# Patient Record
Sex: Male | Born: 1985 | Race: Black or African American | Hispanic: No | Marital: Married | Smoking: Never smoker
Health system: Southern US, Community
[De-identification: ages and names within clinical notes are randomized; demographics above are authoritative.]

## PROBLEM LIST (undated history)

## (undated) HISTORY — PX: NO PAST SURGERIES: SHX2092

---

## 2013-11-15 ENCOUNTER — Emergency Department (HOSPITAL_COMMUNITY)
Admission: EM | Admit: 2013-11-15 | Discharge: 2013-11-15 | Disposition: A | Discharge status: Home / Self Care | Payer: No Typology Code available for payment source | Attending: Emergency Medicine | Admitting: Emergency Medicine

## 2013-11-15 ENCOUNTER — Encounter (HOSPITAL_COMMUNITY): Payer: Self-pay | Admitting: Emergency Medicine

## 2013-11-15 DIAGNOSIS — Y9389 Activity, other specified: Secondary | ICD-10-CM | POA: Diagnosis not present

## 2013-11-15 DIAGNOSIS — Z880 Allergy status to penicillin: Secondary | ICD-10-CM | POA: Insufficient documentation

## 2013-11-15 DIAGNOSIS — Y92410 Unspecified street and highway as the place of occurrence of the external cause: Secondary | ICD-10-CM | POA: Diagnosis not present

## 2013-11-15 DIAGNOSIS — Y998 Other external cause status: Secondary | ICD-10-CM | POA: Diagnosis not present

## 2013-11-15 DIAGNOSIS — S299XXA Unspecified injury of thorax, initial encounter: Secondary | ICD-10-CM | POA: Diagnosis present

## 2013-11-15 DIAGNOSIS — M546 Pain in thoracic spine: Secondary | ICD-10-CM

## 2013-11-15 MED ORDER — NAPROXEN 250 MG PO TABS
500.0000 mg | ORAL_TABLET | Freq: Once | ORAL | Status: AC
  Administered 2013-11-15: 500 mg via ORAL
  Filled 2013-11-15: qty 2

## 2013-11-15 MED ORDER — CYCLOBENZAPRINE HCL 10 MG PO TABS: 10.0000 mg | ORAL_TABLET | Freq: Two times a day (BID) | ORAL | Status: DC | PRN

## 2013-11-15 MED ORDER — NAPROXEN 500 MG PO TABS: 500.0000 mg | ORAL_TABLET | Freq: Two times a day (BID) | ORAL | Status: DC

## 2013-11-15 MED ORDER — CYCLOBENZAPRINE HCL 10 MG PO TABS
10.0000 mg | ORAL_TABLET | Freq: Once | ORAL | Status: AC
  Administered 2013-11-15: 10 mg via ORAL
  Filled 2013-11-15: qty 1

## 2013-11-15 NOTE — Discharge Instructions (Signed)
Please follow the directions provided. Please take the naproxen by mouth twice a day and you may take the Flexeril by mouth twice a day for pain and muscle spasms. If your pain persists, he may use the orthopedic referral given.don't hesitate to return for any new, worsening, or concerning symptoms.  SEEK IMMEDIATE MEDICAL CARE IF:  You have numbness, tingling, or weakness in the arms or legs.  You develop severe headaches not relieved with medicine.  You have severe neck pain, especially tenderness in the middle of the back of your neck.  You have changes in bowel or bladder control.  There is increasing pain in any area of the body.  You have shortness of breath, light-headedness, dizziness, or fainting.  You have chest pain.  You feel sick to your stomach (nauseous), throw up (vomit), or sweat.  You have increasing abdominal discomfort.  There is blood in your urine, stool, or vomit.  You have pain in your shoulder (shoulder strap areas).  You feel your symptoms are getting worse.    Emergency Department Resource Guide 1) Find a Doctor and Pay Out of Pocket Although you won't have to find out who is covered by your insurance plan, it is a good idea to ask around and get recommendations. You will then need to call the office and see if the doctor you have chosen will accept you as a new patient and what types of options they offer for patients who are self-pay. Some doctors offer discounts or will set up payment plans for their patients who do not have insurance, but you will need to ask so you aren't surprised when you get to your appointment.  2) Contact Your Local Health Department Not all health departments have doctors that can see patients for sick visits, but many do, so it is worth a call to see if yours does. If you don't know where your local health department is, you can check in your phone book. The CDC also has a tool to help you locate your state's health department, and many  state websites also have listings of all of their local health departments.  3) Find a Walk-in Clinic If your illness is not likely to be very severe or complicated, you may want to try a walk in clinic. These are popping up all over the country in pharmacies, drugstores, and shopping centers. They're usually staffed by nurse practitioners or physician assistants that have been trained to treat common illnesses and complaints. They're usually fairly quick and inexpensive. However, if you have serious medical issues or chronic medical problems, these are probably not your best option.  No Primary Care Doctor: - Call Health Connect at  [phone] - they can help you locate a primary care doctor that  accepts your insurance, provides certain services, etc. - Physician Referral Service- [phone]  Chronic Pain Problems: Organization         Address  Phone   Notes  [facility]  [phone]) [phone] Patients need to be referred by their primary care doctor.   Medication Assistance: Organization         Address  Phone   Notes  [facility], Thessistance Program [address] [place], [place] [address] [phone] --Must be a resident of [place] -- Must have NO insurance coverage whatsoever (no Medicaid/ Medicare, etc.) -- The pt. MUST have a primary care doctor that directs their care regularly and follows them in the community  [facility]ist  [phone]   [employer]  [phone]    Agencies that provide inexpensive medical care: Organization         Address  Phone   Notes  [facility]  [phone]) [phone]   [facility]    [phone](336) [phone]   [facility] [address] [place], [place] [address] [phone]) [phone]   [facility] [address], [place] [phone]) [phone]   Planned Parenthood    [phone]   [facility]    [phone](336) [phone]   Community Health and  [facility]  [address], [place] Phone:  [phone], Fax:  [phone] Hours of Operation:  9 am - 6 pm, M-F.  Also accepts Medicaid/Medicare and self-pay.  [facility]  [address], [place] Phone: [phone](336) [phone], Fax: [phone](336) [phone]. Hours of Operation:  8:30 am - 5:30 pm, M-F.  Also accepts Medicaid and self-pay.  [facility] [place] [address], [place] Phone: [phone]) [phone]   [facility] [address], [place], [place] [phone], Ext. [phone] Mondays & Thursdays: 7-9 AM.  First 15 patients are seen on a first come, first serve basis.    Medicaid-accepting [facility] Providers:  Organization         Address  Phone   Notes  [facility] [address], [place] [phone] Also accepts self-pay patients.  [facility] [address], [place]  [phone]   [facility] [address], [place] [phone]) [phone]   [facility] [address] [place] [address], [place] [phone]) [phone]   Renaye RakersVeita Bland [address], [place]   [phone] Only accepts [place] Access [place] patients after they have their name applied to their card.   Self-Pay (no insurance) in [facility]:  Organization         Address  Phone   Notes  Sickle Cell Patients, [facility] [address], [place] [phone]   [facility] [address], [place] [phone]) [phone]   [facility] Aristes  [address], [place] [phone]   [facility]/Dr. Osei-Bonsu  [address] [place] [address], [place] or [phone], [place] [phone]) [phone] Phone number for both [place] and [place] locations is the same.  [facility] [address], [place] [phone]   [facility] [address]  [place] [address], [place] or [address] [phone]) [phone]) [phone]   [facility] [address], [place] [phone](336) [phone], phone; [phone]) [phone], fax Sees patients 1st and 3rd Saturday of every month.  Must not qualify for public or private insurance (i.e. Medicaid, Medicare, [place] Health Choice, Veterans' Benefits)  Household income should be no more than 200% of the poverty level The clinic cannot treat you if you are pregnant or think you are pregnant  Sexually transmitted diseases are not treated at the clinic.    Dental Care: Organization         Address  Phone  Notes  [facility] Department of [facility] [address], [place] [phone] Accepts children up to age 29 who are enrolled in [place] or [facility] Health Choice;  pregnant women with a Medicaid card; and children who have applied for Medicaid or [place] Health Choice, but were declined, whose parents can pay a reduced fee at time of service.  [facility] of [facility]  [address], [place] [phone] Accepts children up to age 52 who are enrolled in [place] or [place] Health Choice; pregnant women with a Medicaid card; and children who have applied for Medicaid or [place] Health Choice, but were declined, whose parents can pay a reduced fee at time of service.  [facility]  [address] [place], [place] [phone] Patients are seen by appointment only. Walk-ins are not accepted. [facility] will see patients 31 years of age and older. Monday - Tuesday (8am-5pm) Most Wednesdays (8:30-5pm) $30 per visit, cash only  [facility] Access PROGRAM  [address], [facility] [phone] Patients are seen by appointment only. Walk-ins are not accepted. [facility] will see patients 53 years of age and older. One Wednesday Evening (Monthly: Volunteer Based).  $30 per visit, cash only  [employer]  [phone] for adults; Children under age 64, call Graduate Pediatric Dentistry at [phone]. Children aged 89-14, please call [phone] to request a pediatric application.  Dental services are provided in all areas of dental care including fillings, crowns and bridges, complete and partial dentures, implants, gum treatment, root canals, and extractions. Preventive care is also provided. Treatment is provided to both adults and children. Patients are selected via a lottery and there is often a waiting list.   [facility] [address], [place]  [phone] [email or web address]   [facility] [address] [place], [place] [phone], Ext. [phone] Second and Fourth Thursday of each month, opens at 6:30 AM; Clinic ends at 9 AM.  Patients are seen on a first-come first-served basis, and a limited number are seen during each clinic.   [facility]  [address] Ether [place]ins [place], [place] [phone]   Eligibility Requirements You must have lived in [place], [place], or [place] counties for at least the last three months.   You cannot be eligible for state or federal sponsored [place], including [employer], [place], or [employer].   You generally cannot be eligible for healthcare insurance through your employer.    How to apply: Eligibility screenings are held every Tuesday and Wednesday afternoon from 1:00 pm until 4:00 pm. You do not need an appointment for the interview!  [facility] [address], [place], [place] [phone]   [facility]  [phone]   [facility] Health Department  [phone]   [facility] Health Department  [phone]    Behavioral Health Resources in the Community: Intensive Outpatient Programs Organization         Address  Phone  Notes  [facility] [address] [place], [place]  [phone]   [facility] Outpatient [address] [place], [place] [phone]   [facility]: [facility] Drug Sv[facility] [address] [place], [place]  [phone]   [facility] [address]  [place], [place] [phone] or [phone]   Substance Abuse Resources Organization         Address  Phone  Notes  Al[facility] Drug Services  [phone]   [facility]  [phone]   [facility] [place]  [phone]  Floydene Flock  [phone]   Residential & Outpatient Substance Abuse Program  [phone]   Psychological Services Organization         Address  Phone  Notes  [facility] Behavior[facility] Health  [phone]   [facility]  [phone]   [facility] Mental Health [address] [place] [phone] or [phone]    Mobile Crisis Teams Organization         Address  Phone  Notes  [facility], Mobile Crisis Care Unit  [phone]   [facility]  [address] [place], [place] [phone]   Doristine Locks [address] [place] [phone]    Self-Help/Support Groups Organization         Address  Phone             Notes  [facility] [place] - variety of support groups  [phone]- [phone] Call for more information  Narcotics Anonymous (NA), [facility] [address], [employer] [place]  2 meetings at this location   Statistician         Address  Phone  Notes  [facility] [address],    [place]  [phone]   [facility]  [address] [place] [address], [place], [place] [phone]   [facility] [address] [place], [place] [phone] Admissions: 8am-3pm M-F  [facility] [address]    [place], [place] [phone]   [facility] [address] [place], [place], [place] [phone]   [facility] [address]    [place], [place] [phone]   [facility] - Intensive Outpatient [address], [place], [place] [phone]   [facility] ([facility] [address] [place].,  [place], [place] [phone] or [phone]   [facility] ([facility]) [address], [place], [place] [phone] Accepts Medicaid  [facility] [place] [address],  [place] [phone] Substance Abuse/Addiction Treatment   [facility] Organization         Address  Phone  Notes  [facility]  [phone]   Angie Fava, PhD [address] [place], [place]   [phone] or [phone]   [facility]   [address] [place], [place] [phone]   [facility] [address] [place], [place] [phone] Insurance/Medicaid/sponsorship through [facility] and Families [address]                                    [place], [place] [phone] Therapy/tele-psych/case  [facility] [address], [place] [phone]    Dr. Lolly Mustache  [phone]   [facility] [place]  [employer] [facility] Dept. 1) [address] [place] 2) [address], [place] 3)  [address], [place] [phone]  [phone]   [facility] Child Abuse Hotline 236-785-1354 or [phone] (After Hours)
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
## 2013-11-17 ENCOUNTER — Emergency Department (HOSPITAL_COMMUNITY)
Admission: EM | Admit: 2013-11-17 | Discharge: 2013-11-17 | Disposition: A | Discharge status: Home / Self Care | Payer: No Typology Code available for payment source | Attending: Emergency Medicine | Admitting: Emergency Medicine

## 2013-11-17 ENCOUNTER — Encounter (HOSPITAL_COMMUNITY): Payer: Self-pay | Admitting: Emergency Medicine

## 2013-11-17 DIAGNOSIS — G8911 Acute pain due to trauma: Secondary | ICD-10-CM | POA: Insufficient documentation

## 2013-11-17 DIAGNOSIS — M542 Cervicalgia: Secondary | ICD-10-CM | POA: Diagnosis present

## 2013-11-17 DIAGNOSIS — M25512 Pain in left shoulder: Secondary | ICD-10-CM | POA: Insufficient documentation

## 2013-11-17 DIAGNOSIS — M25511 Pain in right shoulder: Secondary | ICD-10-CM | POA: Diagnosis not present

## 2013-11-17 DIAGNOSIS — Z791 Long term (current) use of non-steroidal anti-inflammatories (NSAID): Secondary | ICD-10-CM | POA: Diagnosis not present

## 2013-11-17 DIAGNOSIS — M549 Dorsalgia, unspecified: Secondary | ICD-10-CM | POA: Diagnosis not present

## 2013-11-17 DIAGNOSIS — Z880 Allergy status to penicillin: Secondary | ICD-10-CM | POA: Diagnosis not present

## 2013-11-17 NOTE — Discharge Instructions (Signed)
Continue to take your medications as prescribed. [facility].  See below for further instructions.
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
## 2013-11-22 ENCOUNTER — Emergency Department (HOSPITAL_COMMUNITY)
Admission: EM | Admit: 2013-11-22 | Discharge: 2013-11-22 | Disposition: A | Discharge status: Home / Self Care | Payer: No Typology Code available for payment source

## 2013-11-22 NOTE — ED Notes (Signed)
Cannot find pt no answer to name or paGE

## 2013-11-22 NOTE — ED Notes (Signed)
CANNOT FIND PT NO ANSWER TO NAME IN WAITING ROOM OR OUTSIDE

## 2017-10-02 ENCOUNTER — Emergency Department (HOSPITAL_COMMUNITY)
Admission: EM | Admit: 2017-10-02 | Discharge: 2017-10-02 | Disposition: A | Discharge status: Home / Self Care | Payer: Self-pay | Attending: Emergency Medicine | Admitting: Emergency Medicine

## 2017-10-02 ENCOUNTER — Encounter (HOSPITAL_COMMUNITY): Payer: Self-pay | Admitting: *Deleted

## 2017-10-02 ENCOUNTER — Other Ambulatory Visit: Payer: Self-pay

## 2017-10-02 DIAGNOSIS — M545 Low back pain: Secondary | ICD-10-CM | POA: Insufficient documentation

## 2017-10-02 MED ORDER — KETOROLAC TROMETHAMINE 60 MG/2ML IM SOLN
60.0000 mg | Freq: Once | INTRAMUSCULAR | Status: AC
  Administered 2017-10-02: 60 mg via INTRAMUSCULAR
  Filled 2017-10-02: qty 2

## 2017-10-02 MED ORDER — DEXAMETHASONE SODIUM PHOSPHATE 10 MG/ML IJ SOLN
10.0000 mg | Freq: Once | INTRAMUSCULAR | Status: AC
  Administered 2017-10-02: 10 mg via INTRAMUSCULAR
  Filled 2017-10-02: qty 1

## 2017-10-02 MED ORDER — NAPROXEN 500 MG PO TABS: 500.0000 mg | ORAL_TABLET | Freq: Two times a day (BID) | ORAL | 0 refills | Status: DC

## 2017-10-02 MED ORDER — METHOCARBAMOL 500 MG PO TABS: 500.0000 mg | ORAL_TABLET | Freq: Three times a day (TID) | ORAL | 0 refills | Status: DC | PRN

## 2017-10-02 NOTE — ED Triage Notes (Signed)
Pt c/o low back pain for the past two days. Denies bowel or bladder incontinence.

## 2017-10-02 NOTE — ED Provider Notes (Signed)
[facility] EMERGENCY DEPARTMENT Provider Note   CSN: [id number] Arrival date & time: 10/02/17  0013     History   Chief Complaint Chief Complaint  Patient presents with  . Back Pain    HPI Kevin Mckenzie is a 32 y.o. male.   The history is provided by the patient. No language interpreter was used.  Back Pain   This is a new problem. Episode onset: 2 days ago. The problem occurs constantly. The problem has been gradually improving. The pain is associated with no known injury. The pain is present in the lumbar spine. The quality of the pain is described as stabbing. The pain radiates to the right thigh. The pain is moderate. The symptoms are aggravated by bending and twisting. Pertinent negatives include no fever, no bowel incontinence, no bladder incontinence, no paresthesias and no weakness. He has tried NSAIDs for the symptoms. The treatment provided mild relief. Risk factors include obesity (Hx of similar pain a few years ago; performs heavy lifting frequently.).    History reviewed. No pertinent past medical history.  There are no active problems to display for this patient.   History reviewed. No pertinent surgical history.      Home Medications    Prior to Admission medications   Medication Sig Start Date End Date Taking? Authorizing Provider  cyclobenzaprine (FLEXERIL) 10 MG tablet Take 1 tablet (10 mg total) by mouth 2 (two) times daily as needed for muscle spasms. 11/15/13   Harle Battiest, NP  methocarbamol (ROBAXIN) 500 MG tablet Take 1 tablet (500 mg total) by mouth every 8 (eight) hours as needed for muscle spasms. 10/02/17   Antony Madura, PA-C  naproxen (NAPROSYN) 500 MG tablet Take 1 tablet (500 mg total) by mouth 2 (two) times daily with a meal. 10/02/17   Antony Madura, PA-C    Family History No family history on file.  Social History Social History   Tobacco Use  . Smoking status: Never Smoker  . Smokeless tobacco: Never Used    Substance Use Topics  . Alcohol use: No  . Drug use: No     Allergies   Penicillins   Review of Systems Review of Systems  Constitutional: Negative for fever.  Gastrointestinal: Negative for bowel incontinence.  Genitourinary: Negative for bladder incontinence.  Musculoskeletal: Positive for back pain.  Neurological: Negative for weakness and paresthesias.  Ten systems reviewed and are negative for acute change, except as noted in the HPI.    Physical Exam Updated Vital Signs BP (!) 159/83   Pulse 97   Temp 98.1 F (36.7 C)   Resp 17   SpO2 98%   Physical Exam  Constitutional: He is oriented to person, place, and time. He appears well-developed and well-nourished. No distress.  Nontoxic appearing and in NAD  HENT:  Head: Normocephalic and atraumatic.  Eyes: Conjunctivae and EOM are normal. No scleral icterus.  Neck: Normal range of motion.  Cardiovascular: Normal rate, regular rhythm and intact distal pulses.  Pulmonary/Chest: Effort normal. No respiratory distress.  Respirations even and unlabored  Musculoskeletal: Normal range of motion.  No reproducible pain on palpation of the low back. Positive straight leg raise on the right. Negative crossed straight leg raise. No bony deformities, step offs, crepitus to the lumbosacral midline.  Neurological: He is alert and oriented to person, place, and time. He exhibits normal muscle tone. Coordination normal.  Ambulatory with antalgic gait.  Skin: Skin is warm and dry. No rash noted. He is  not diaphoretic. No erythema. No pallor.  Psychiatric: He has a normal mood and affect. His behavior is normal.  Nursing note and vitals reviewed.    ED Treatments / Results  Labs (all labs ordered are listed, but only abnormal results are displayed) Labs Reviewed - No data to display  EKG None  Radiology No results found.  Procedures Procedures (including critical care time)  Medications Ordered in ED Medications   ketorolac (TORADOL) injection 60 mg (has no administration in time range)  dexamethasone (DECADRON) injection 10 mg (has no administration in time range)     Initial Impression / Assessment and Plan / ED Course  I have reviewed the triage vital signs and the nursing notes.  Pertinent labs & imaging results that were available during my care of the patient were reviewed by me and considered in my medical decision making (see chart for details).     Patient with back pain.  Hx of similar pain which improved with supportive treatment.  No reported injury or fall inciting onset of symptoms 2 days ago.  Patient neurovascularly intact on exam.  Patient can walk but states is painful.  No loss of bowel or bladder control.  No concern for cauda equina.  No fever, hx of CA, hx of IVDU.  Plan for discharge on Naproxen and Robaxin.  Advised alternation of ice and heat.  Return precautions discussed and provided. Patient discharged in stable condition with no unaddressed concerns.   Final Clinical Impressions(s) / ED Diagnoses   Final diagnoses:  Acute right-sided low back pain, with sciatica presence unspecified    ED Discharge Orders         Ordered    naproxen (NAPROSYN) 500 MG tablet  2 times daily with meals     10/02/17 0240    methocarbamol (ROBAXIN) 500 MG tablet  Every 8 hours PRN     10/02/17 0240           Antony Madura, PA-C 10/02/17 0248    Zadie Rhine, MD 10/02/17 [phone]

## 2017-10-02 NOTE — ED Notes (Signed)
Pt refused discharge vitals 

## 2017-10-02 NOTE — Discharge Instructions (Signed)
Alternate ice and heat to areas of injury 3-4 times per day to limit inflammation and spasm.  Avoid strenuous activity and heavy lifting.  We recommend consistent use of naproxen in addition to Robaxin for muscle spasms. Do not drive or drink alcohol after taking Robaxin as it may make you drowsy and impair your judgment.  We recommend follow-up with a primary care doctor to ensure resolution of symptoms.  Return to the ED for any new or concerning symptoms. 

## 2017-10-02 NOTE — ED Notes (Signed)
Patient verbalizes understanding of discharge instructions. Opportunity for questioning and answers were provided. Armband removed by staff, pt discharged from ED ambulatory.   

## 2021-04-19 LAB — LAB REPORT - SCANNED
A1c: 6.6
EGFR: 87

## 2021-06-18 ENCOUNTER — Ambulatory Visit
Admission: RE | Admit: 2021-06-18 | Discharge: 2021-06-18 | Disposition: A | Discharge status: Home / Self Care | Payer: Managed Care, Other (non HMO) | Source: Ambulatory Visit | Attending: Physician Assistant | Admitting: Physician Assistant

## 2021-06-18 ENCOUNTER — Other Ambulatory Visit: Payer: Self-pay | Admitting: Physician Assistant

## 2021-06-18 DIAGNOSIS — M7041 Prepatellar bursitis, right knee: Secondary | ICD-10-CM

## 2021-07-07 ENCOUNTER — Other Ambulatory Visit: Payer: Self-pay

## 2021-07-07 ENCOUNTER — Encounter (HOSPITAL_COMMUNITY): Payer: Self-pay

## 2021-07-07 ENCOUNTER — Emergency Department (HOSPITAL_COMMUNITY)
Admission: EM | Admit: 2021-07-07 | Discharge: 2021-07-07 | Disposition: A | Discharge status: Home / Self Care | Payer: Managed Care, Other (non HMO) | Attending: Emergency Medicine | Admitting: Emergency Medicine

## 2021-07-07 ENCOUNTER — Emergency Department (HOSPITAL_COMMUNITY): Payer: Managed Care, Other (non HMO)

## 2021-07-07 DIAGNOSIS — S6992XA Unspecified injury of left wrist, hand and finger(s), initial encounter: Secondary | ICD-10-CM | POA: Diagnosis present

## 2021-07-07 DIAGNOSIS — Z23 Encounter for immunization: Secondary | ICD-10-CM | POA: Diagnosis not present

## 2021-07-07 DIAGNOSIS — W540XXA Bitten by dog, initial encounter: Secondary | ICD-10-CM | POA: Insufficient documentation

## 2021-07-07 DIAGNOSIS — S61217A Laceration without foreign body of left little finger without damage to nail, initial encounter: Secondary | ICD-10-CM | POA: Diagnosis not present

## 2021-07-07 DIAGNOSIS — S61512A Laceration without foreign body of left wrist, initial encounter: Secondary | ICD-10-CM | POA: Diagnosis not present

## 2021-07-07 DIAGNOSIS — Y92007 Garden or yard of unspecified non-institutional (private) residence as the place of occurrence of the external cause: Secondary | ICD-10-CM | POA: Diagnosis not present

## 2021-07-07 MED ORDER — TETANUS-DIPHTH-ACELL PERTUSSIS 5-2.5-18.5 LF-MCG/0.5 IM SUSY
0.5000 mL | PREFILLED_SYRINGE | Freq: Once | INTRAMUSCULAR | Status: AC
  Administered 2021-07-07: 0.5 mL via INTRAMUSCULAR
  Filled 2021-07-07: qty 0.5

## 2021-07-07 MED ORDER — ACETAMINOPHEN 325 MG PO TABS
650.0000 mg | ORAL_TABLET | Freq: Once | ORAL | Status: AC
Start: 2021-07-07 — End: 2021-07-07
  Administered 2021-07-07: 650 mg via ORAL
  Filled 2021-07-07: qty 2

## 2021-07-07 MED ORDER — IBUPROFEN 400 MG PO TABS
600.0000 mg | ORAL_TABLET | Freq: Once | ORAL | Status: AC
  Administered 2021-07-07: 600 mg via ORAL
  Filled 2021-07-07: qty 1

## 2021-07-07 MED ORDER — BACITRACIN ZINC 500 UNIT/GM EX OINT
TOPICAL_OINTMENT | Freq: Once | CUTANEOUS | Status: AC
  Administered 2021-07-07: 1 via TOPICAL
  Filled 2021-07-07: qty 1.8

## 2021-07-07 MED ORDER — LIDOCAINE HCL (PF) 1 % IJ SOLN
30.0000 mL | Freq: Once | INTRAMUSCULAR | Status: AC
  Administered 2021-07-07: 30 mL
  Filled 2021-07-07: qty 30

## 2021-07-07 MED ORDER — SULFAMETHOXAZOLE-TRIMETHOPRIM 800-160 MG PO TABS: 1.0000 | ORAL_TABLET | Freq: Two times a day (BID) | ORAL | 0 refills | Status: AC

## 2021-07-07 NOTE — Discharge Instructions (Addendum)
Please use Tylenol or ibuprofen for pain.  You may use 600 mg ibuprofen every 6 hours or 1000 mg of Tylenol every 6 hours.  You may choose to alternate between the 2.  This would be most effective.  Not to exceed 4 g of Tylenol within 24 hours.  Not to exceed 3200 mg ibuprofen 24 hours.  Please take the entire course of antibiotics that I prescribed.  I recommend that you take them on a full stomach.  If you see any signs of worsening swelling, redness, pain, purulent drainage please return the emergency department for further evaluation as these can be signs of an infection.  If you are concerned about the weight that the wound is healing I recommend that you follow-up with a hand surgeon for further evaluation.  The stitches do dissolve that you can return for removal if they are irritating you in around 10 days.  As we discussed you have a low concern for the animal that you were bitten by being rabid and choose not to receive rabies prophylaxis at this time, however you can return anytime in the next 7 days to begin rabies vaccine if you develop concern for possible rabies exposure.

## 2021-07-07 NOTE — ED Provider Notes (Signed)
[facility] EMERGENCY DEPARTMENT Provider Note   CSN: [id number] Arrival date & time: 07/07/21  1051     History  Chief Complaint  Patient presents with   Animal Bite    Kevin Mckenzie is a 36 y.o. male with no significant past medical history who presents with concern for dog bite sustained just prior to arrival with laceration noted on left fifth finger and left wrist.  Patient reports that it was an unknown dog but did not have a collar.  Patient reports dog was standing in the yard, and did not want to leave despite him shoeing it away.  Patient reports that he attempted to approach the dog and then it started growling and lunged at him.  Patient reports 6 out of 10 pain on arrival, now reports 10/10 pain.  He denies numbness, tingling.  He does not know when his last tetanus shot was.  He does not know about the dog's vaccination status, and animal control was not contacted to trap the dog.  HPI     Home Medications Prior to Admission medications   Medication Sig Start Date End Date Taking? Authorizing Provider  sulfamethoxazole-trimethoprim (BACTRIM DS) 800-160 MG tablet Take 1 tablet by mouth 2 (two) times daily for 7 days. 07/07/21 07/14/21 Yes Deyna Carbon H, PA-C  cyclobenzaprine (FLEXERIL) 10 MG tablet Take 1 tablet (10 mg total) by mouth 2 (two) times daily as needed for muscle spasms. 11/15/13   Harle Battiest, NP  methocarbamol (ROBAXIN) 500 MG tablet Take 1 tablet (500 mg total) by mouth every 8 (eight) hours as needed for muscle spasms. 10/02/17   Antony Madura, PA-C  naproxen (NAPROSYN) 500 MG tablet Take 1 tablet (500 mg total) by mouth 2 (two) times daily with a meal. 10/02/17   Antony Madura, PA-C      Allergies    Penicillins    Review of Systems   Review of Systems  Skin:  Positive for wound.  All other systems reviewed and are negative.   Physical Exam Updated Vital Signs BP 128/85   Pulse 89   Temp 97.8 F (36.6 C) (Oral)   Resp  16   Ht 5\' 11"  (1.803 m)   Wt 125.6 kg   SpO2 100%   BMI 38.63 kg/m  Physical Exam Vitals and nursing note reviewed.  Constitutional:      General: He is not in acute distress.    Appearance: Normal appearance.  HENT:     Head: Normocephalic and atraumatic.  Eyes:     General:        Right eye: No discharge.        Left eye: No discharge.  Cardiovascular:     Rate and Rhythm: Normal rate and regular rhythm.  Pulmonary:     Effort: Pulmonary effort is normal. No respiratory distress.  Musculoskeletal:        General: No deformity.     Comments: No visualized tendon damage or rupture.  Strength to flexion extension of the affected fifth digit.  Intact strength flexion, extension of the wrist.  Distally significant soft tissue swelling patient without significant tenderness palpation of the radius or ulna on the left.  Skin:    General: Skin is warm and dry.     Comments: Soft tissue swelling, small laceration noted to posterior wrist on the left.  Large laceration on the ventral aspect of proximal fifth phalanx.  Other scattered abrasions, and soft tissue swelling.  Neurological:  Mental Status: He is alert and oriented to person, place, and time.  Psychiatric:        Mood and Affect: Mood normal.        Behavior: Behavior normal.     ED Results / Procedures / Treatments   Labs (all labs ordered are listed, but only abnormal results are displayed) Labs Reviewed - No data to display  EKG None  Radiology DG Wrist Complete Left  Result Date: 07/07/2021 CLINICAL DATA:  dog bite EXAM: LEFT WRIST - COMPLETE 3+ VIEW; LEFT HAND - COMPLETE 3+ VIEW COMPARISON:  None Available. FINDINGS: There is no evidence of fracture or dislocation. There is no evidence of severe arthropathy or other focal bone abnormality. Marked subcutaneus soft tissue edema and emphysema along the lateral dorsal wrist. Associated 5 x 2 mm oval density within this superficial soft tissues of unclear etiology.  IMPRESSION: 1. Marked subcutaneus soft tissue edema and emphysema along the lateral dorsal wrist. Associated 5 x 2 mm oval density within this superficial soft tissues of unclear etiology. Finding could represent a retained foreign body. 2. No acute displaced fracture or dislocation of the bones of the left hand and wrist. Electronically Signed   By: Tish Frederickson M.D.   On: 07/07/2021 15:33   DG Hand Complete Left  Result Date: 07/07/2021 CLINICAL DATA:  dog bite EXAM: LEFT WRIST - COMPLETE 3+ VIEW; LEFT HAND - COMPLETE 3+ VIEW COMPARISON:  None Available. FINDINGS: There is no evidence of fracture or dislocation. There is no evidence of severe arthropathy or other focal bone abnormality. Marked subcutaneus soft tissue edema and emphysema along the lateral dorsal wrist. Associated 5 x 2 mm oval density within this superficial soft tissues of unclear etiology. IMPRESSION: 1. Marked subcutaneus soft tissue edema and emphysema along the lateral dorsal wrist. Associated 5 x 2 mm oval density within this superficial soft tissues of unclear etiology. Finding could represent a retained foreign body. 2. No acute displaced fracture or dislocation of the bones of the left hand and wrist. Electronically Signed   By: Tish Frederickson M.D.   On: 07/07/2021 15:33    Procedures .Marland KitchenLaceration Repair  Date/Time: 07/07/2021 4:36 PM  Performed by: Olene Floss, PA-C Authorized by: Olene Floss, PA-C   Consent:    Consent obtained:  Verbal   Consent given by:  Patient   Risks, benefits, and alternatives were discussed: yes     Risks discussed:  Infection, pain, need for additional repair, poor cosmetic result, poor wound healing, tendon damage and retained foreign body   Alternatives discussed:  No treatment Universal protocol:    Procedure explained and questions answered to patient or proxy's satisfaction: yes     Patient identity confirmed:  Verbally with patient Anesthesia:    Anesthesia  method:  Local infiltration   Local anesthetic:  Lidocaine 1% w/o epi Laceration details:    Location:  Shoulder/arm   Shoulder/arm location:  L lower arm   Length (cm):  2.5   Depth (mm):  4 Treatment:    Area cleansed with:  Povidone-iodine and saline   Amount of cleaning:  Extensive   Irrigation solution:  Sterile saline   Irrigation method:  Pressure wash Skin repair:    Repair method:  Sutures   Suture size:  5-0   Suture material:  Chromic gut   Suture technique:  Simple interrupted   Number of sutures:  2 Approximation:    Approximation:  Loose Repair type:    Repair  type:  Intermediate Post-procedure details:    Dressing:  Antibiotic ointment and non-adherent dressing   Procedure completion:  Tolerated .Marland KitchenLaceration Repair  Date/Time: 07/07/2021 4:37 PM  Performed by: Olene Floss, PA-C Authorized by: Olene Floss, PA-C   Consent:    Consent obtained:  Verbal   Consent given by:  Patient   Risks, benefits, and alternatives were discussed: yes     Risks discussed:  Infection, pain, poor cosmetic result, poor wound healing, tendon damage, need for additional repair, retained foreign body and nerve damage   Alternatives discussed:  No treatment Universal protocol:    Procedure explained and questions answered to patient or proxy's satisfaction: yes     Patient identity confirmed:  Verbally with patient Anesthesia:    Anesthesia method:  Local infiltration   Local anesthetic:  Lidocaine 1% w/o epi Laceration details:    Location:  Finger   Finger location:  L small finger   Length (cm):  4   Depth (mm):  8 Treatment:    Area cleansed with:  Povidone-iodine and saline   Amount of cleaning:  Extensive   Irrigation solution:  Sterile saline   Irrigation method:  Pressure wash Skin repair:    Repair method:  Sutures   Suture size:  5-0   Suture material:  Prolene   Suture technique:  Simple interrupted   Number of sutures:  6 Approximation:     Approximation:  Loose Repair type:    Repair type:  Intermediate Post-procedure details:    Dressing:  Antibiotic ointment and non-adherent dressing   Procedure completion:  Tolerated     Medications Ordered in ED Medications  lidocaine (PF) (XYLOCAINE) 1 % injection 30 mL (30 mLs Infiltration Given 07/07/21 1424)  Tdap (BOOSTRIX) injection 0.5 mL (0.5 mLs Intramuscular Given 07/07/21 1425)  ibuprofen (ADVIL) tablet 600 mg (600 mg Oral Given 07/07/21 1423)  acetaminophen (TYLENOL) tablet 650 mg (650 mg Oral Given 07/07/21 1423)  bacitracin ointment (1 Application Topical Given 07/07/21 1629)    ED Course/ Medical Decision Making/ A&P                           Medical Decision Making Amount and/or Complexity of Data Reviewed Radiology: ordered.  Risk OTC drugs. Prescription drug management.   This patient is a 36 y.o. male who presents to the ED for concern of dog bite, wrist injury, left fifth finger injury, this involves an extensive number of treatment options, and is a complaint that carries with it a high risk of complications and morbidity. The emergent differential diagnosis prior to evaluation includes, but is not limited to, tendon injury or rupture, foreign body, laceration requiring repair, fracture, dislocation.   This is not an exhaustive differential.   Past Medical History / Co-morbidities / Social History: Is not up-to-date on his tetanus shot, does not have rabies vaccine already.  No other contributory past medical history.  Additional history: Chart reviewed. Pertinent results include: Reviewed lab work, imaging from previous emergency department visits, no recent evaluations for evaluation of hand injuries.  Physical Exam: Physical exam performed. The pertinent findings include: Laceration on the medial, internal surface of the left fifth digit at the most proximal phalanx.  No evidence of tendon involvement.  Wound explored through full range of motion with no  evidence of foreign body.  Patient has a laceration on the dorsum of the left wrist that is somewhat dehisced, and a very shallow  laceration on the ventral aspect of the left wrist.  No wounds actively bleeding at time my evaluation.  Patient has significant soft tissue swelling of the affected extremity, no significant tenderness palpation of bony prominences.   Imaging Studies: I ordered imaging studies including plain film x-rays of the left wrist and left hand. I independently visualized and interpreted imaging which showed no evidence of fracture, dislocation, some soft tissue abnormalities, with concern for possible foreign body, both wrist and finger wounds were explored through full range of motion, and I did not note any foreign body. I agree with the radiologist interpretation.   Medications: I ordered medication including Tdap for tetanus prophylaxis.  Patient discharged on Bactrim as he has penicillin allergy and cannot tolerate Augmentin.  Additionally given ibuprofen, Tylenol for pain control in the emergency department today.  I had an extensive discussion with the patient regarding possible rabies prophylaxis.  Patient reports that the animal was normal in appearance with normal behavior until he approached it when it started growling.  He thinks that it was being territorial.  Although it did not have a collar he has low suspicion that it is rapid.  Discussed that there is able low to almost nonexistent rate of rabies and dogs in [facility].  Based on this description I do not think it is necessary that patient receive rabies prophylaxis, but discussed if he has any concerns that he should return within 7 days to begin prophylaxis.  Patient understands and agrees to this plan.  emergency department workup does not suggest an emergent condition requiring admission or immediate intervention beyond what has been performed at this time. The plan is: Lacerations repaired as described above,  encourage I Profen, Tylenol for pain, patient placed on Bactrim for animal bite prophylaxis, encouraged to follow-up for any worsening pain, or signs of infection. The patient is safe for discharge and has been instructed to return immediately for worsening symptoms, change in symptoms or any other concerns.  I discussed this case with my attending physician Dr. Criss Alvine who cosigned this note including patient's presenting symptoms, physical exam, and planned diagnostics and interventions. Attending physician stated agreement with plan or made changes to plan which were implemented.    Final Clinical Impression(s) / ED Diagnoses Final diagnoses:  Dog bite, initial encounter  Laceration of left little finger without foreign body without damage to nail, initial encounter  Laceration of left wrist, initial encounter    Rx / DC Orders ED Discharge Orders          Ordered    sulfamethoxazole-trimethoprim (BACTRIM DS) 800-160 MG tablet  2 times daily        07/07/21 1618              Aidah Forquer, [place] H, PA-C 07/07/21 1638    Pricilla Loveless, MD 07/07/21 2027

## 2021-07-07 NOTE — ED Triage Notes (Signed)
Patient with dog bite to left 5th finger and left wrist, unknown dog. Saline dressing applied and wound care completed

## 2021-12-14 ENCOUNTER — Ambulatory Visit
Admission: EM | Admit: 2021-12-14 | Discharge: 2021-12-14 | Disposition: A | Discharge status: Home / Self Care | Payer: BC Managed Care – PPO | Attending: Emergency Medicine | Admitting: Emergency Medicine

## 2021-12-14 DIAGNOSIS — Z1152 Encounter for screening for COVID-19: Secondary | ICD-10-CM | POA: Diagnosis not present

## 2021-12-14 DIAGNOSIS — J988 Other specified respiratory disorders: Secondary | ICD-10-CM | POA: Insufficient documentation

## 2021-12-14 DIAGNOSIS — Z20828 Contact with and (suspected) exposure to other viral communicable diseases: Secondary | ICD-10-CM | POA: Insufficient documentation

## 2021-12-14 DIAGNOSIS — B9789 Other viral agents as the cause of diseases classified elsewhere: Secondary | ICD-10-CM | POA: Diagnosis not present

## 2021-12-14 LAB — RESP PANEL BY RT-PCR (FLU A&B, COVID) ARPGX2
Influenza A by PCR: NEGATIVE
Influenza B by PCR: NEGATIVE
SARS Coronavirus 2 by RT PCR: NEGATIVE

## 2021-12-14 MED ORDER — IBUPROFEN 600 MG PO TABS: 600.0000 mg | ORAL_TABLET | Freq: Four times a day (QID) | ORAL | 0 refills | Status: DC | PRN

## 2021-12-14 MED ORDER — IPRATROPIUM BROMIDE 0.06 % NA SOLN: 2.0000 | Freq: Four times a day (QID) | NASAL | 0 refills | Status: DC

## 2021-12-14 MED ORDER — PROMETHAZINE-DM 6.25-15 MG/5ML PO SYRP: 5.0000 mL | ORAL_SOLUTION | Freq: Four times a day (QID) | ORAL | 0 refills | Status: DC | PRN

## 2021-12-14 NOTE — Discharge Instructions (Signed)
We we will contact you if your COVID or flu come back positive and prescribe the appropriate antiviral.  Start Mucinex-D to keep the mucous thin and to decongest you.   You may take 600 mg of motrin with 1000 mg of tylenol up to 3-4 times a day as needed for pain. This is an effective combination for pain.  Atrovent nasal spray to help with the nasal congestion and postnasal drip.  Use a NeilMed sinus rinse with distilled water as often as you want to to reduce nasal congestion. Follow the directions on the box.  Promethazine DM for the cough.  Go to www.goodrx.com to look up your medications. This will give you a list of where you can find your prescriptions at the most affordable prices. Or you can ask the pharmacist what the cash price is. This is frequently cheaper than going through insurance.

## 2021-12-14 NOTE — ED Provider Notes (Signed)
HPI  SUBJECTIVE:  Kevin Mckenzie is a 36 y.o. male who presents with generalized weakness, cough, chills, body aches, headache, nasal congestion, mucoid rhinorrhea, sinus pain and pressure, sore throat from postnasal drip, dry cough, wheezing starting yesterday.  No facial swelling, upper dental pain, shortness of breath, nausea, vomiting, diarrhea, abdominal pain, fevers.  Both of his kids have tested positive for influenza.  No known COVID exposure.  He did not get the flu or COVID-vaccines.  He is unable to sleep at night because of the cough.  No antipyretic in the past 6 hours.  He took 1000 g of Tylenol with improvement in his symptoms.  Symptoms are worse with movement.  He has no past medical history.  [facility].    History reviewed. No pertinent past medical history.  History reviewed. No pertinent surgical history.  History reviewed. No pertinent family history.  Social History   Tobacco Use   Smoking status: Never   Smokeless tobacco: Never  Vaping Use   Vaping Use: Never used  Substance Use Topics   Alcohol use: No   Drug use: No    No current facility-administered medications for this encounter.  Current Outpatient Medications:    acetaminophen (TYLENOL) 500 MG tablet, Take 1,000 mg by mouth every 6 (six) hours as needed., Disp: , Rfl:    ibuprofen (ADVIL) 600 MG tablet, Take 1 tablet (600 mg total) by mouth every 6 (six) hours as needed., Disp: 30 tablet, Rfl: 0   ipratropium (ATROVENT) 0.06 % nasal spray, Place 2 sprays into both nostrils 4 (four) times daily., Disp: 15 mL, Rfl: 0   promethazine-dextromethorphan (PROMETHAZINE-DM) 6.25-15 MG/5ML syrup, Take 5 mLs by mouth 4 (four) times daily as needed for cough., Disp: 118 mL, Rfl: 0  Allergies  Allergen Reactions   Penicillins      ROS  As noted in HPI.   Physical Exam  BP (!) 133/90 (BP Location: Right Arm)   Pulse 91   Temp 99.4 F (37.4 C) (Oral)   Resp 20   SpO2 95%    Constitutional: Well developed, well nourished, no acute distress Eyes:  EOMI, conjunctiva normal bilaterally HENT: Normocephalic, atraumatic,mucus membranes moist.  Extensive clear rhinorrhea.  Erythematous, swollen turbinates.  No maxillary, frontal sinus tenderness.  Unable to completely visualize tonsils and posterior oropharynx.  What I was able to see appears normal. Neck: Positive cervical lymphadenopathy Respiratory: Normal inspiratory effort, lungs clear bilaterally, good air movement Cardiovascular: Normal rate, regular rhythm, no murmurs rubs or gallops GI: nondistended skin: No rash, skin intact Musculoskeletal: no deformities Neurologic: Alert & oriented x 3, no focal neuro deficits Psychiatric: Speech and behavior appropriate   ED Course   Medications - No data to display  Orders Placed This Encounter  Procedures   Resp Panel by RT-PCR (Flu A&B, Covid) Anterior Nasal Swab    Standing Status:   Standing    Number of Occurrences:   1   Results for orders placed or performed during the hospital encounter of 12/14/21  Resp Panel by RT-PCR (Flu A&B, Covid) Anterior Nasal Swab   Specimen: Anterior Nasal Swab  Result Value Ref Range   SARS Coronavirus 2 by RT PCR NEGATIVE NEGATIVE   Influenza A by PCR NEGATIVE NEGATIVE   Influenza B by PCR NEGATIVE NEGATIVE    No results found for this or any previous visit (from the past 24 hour(s)). No results found.  ED Clinical Impression  1. Viral respiratory infection   2.  Exposure to influenza      ED Assessment/Plan     Patient with an upper respiratory infection after close exposure to influenza.  Checking COVID, flu.  Will prescribe antivirals if his testing comes back positive because of his unvaccinated status.  Offered to send home with a prescription of Tamiflu today, but patient would like to wait.  In the meantime, Atrovent nasal spray, Mucinex D, saline nasal irrigation, Tylenol/ibuprofen, Promethazine DM,  work note for 2 days.  Follow-up with PCP as needed.  Respiratory panel negative.  Plan as above.  Discussed labs, MDM, treatment plan, and plan for follow-up with patient. patient agrees with plan.   Meds ordered this encounter  Medications   ibuprofen (ADVIL) 600 MG tablet    Sig: Take 1 tablet (600 mg total) by mouth every 6 (six) hours as needed.    Dispense:  30 tablet    Refill:  0   ipratropium (ATROVENT) 0.06 % nasal spray    Sig: Place 2 sprays into both nostrils 4 (four) times daily.    Dispense:  15 mL    Refill:  0   promethazine-dextromethorphan (PROMETHAZINE-DM) 6.25-15 MG/5ML syrup    Sig: Take 5 mLs by mouth 4 (four) times daily as needed for cough.    Dispense:  118 mL    Refill:  0      *This clinic note was created using Scientist, clinical (histocompatibility and immunogenetics). Therefore, there may be occasional mistakes despite careful proofreading.  ?    Domenick Gong, MD 12/15/21 1207

## 2021-12-14 NOTE — ED Triage Notes (Signed)
Pt states that his kids were dx with the flu and now he has body aches, fever and cough.

## 2022-11-01 ENCOUNTER — Encounter: Payer: Self-pay | Admitting: Family Medicine

## 2022-11-01 ENCOUNTER — Ambulatory Visit (INDEPENDENT_AMBULATORY_CARE_PROVIDER_SITE_OTHER): Payer: Self-pay | Admitting: Family Medicine

## 2022-11-01 VITALS — BP 136/72 | HR 86 | Temp 98.5°F | Ht 71.0 in | Wt 299.8 lb

## 2022-11-01 DIAGNOSIS — Z0130 Encounter for examination of blood pressure without abnormal findings: Secondary | ICD-10-CM | POA: Insufficient documentation

## 2022-11-01 DIAGNOSIS — M7041 Prepatellar bursitis, right knee: Secondary | ICD-10-CM | POA: Insufficient documentation

## 2022-11-01 DIAGNOSIS — Z7689 Persons encountering health services in other specified circumstances: Secondary | ICD-10-CM

## 2022-11-01 DIAGNOSIS — Z1331 Encounter for screening for depression: Secondary | ICD-10-CM | POA: Insufficient documentation

## 2022-11-01 NOTE — Assessment & Plan Note (Signed)
Has had this drained in the past.  Based on chart review it seems as was done July 2023.  Nontender.  No evidence of infection.  Referred to sports medicine for drainage.  Patient will need to wear compression sleeve afterwards.  He has 1 from the last time, but never wore it.  By the time he obtains the sleeve the swelling had returned.

## 2022-11-01 NOTE — Assessment & Plan Note (Signed)
Patient states he takes his blood pressure on a wrist cuff at home and has had some high readings.  The readings he reported were 200/100 at the highest, but more in the 150s, 160s range.  We checked his blood pressure twice today and both times it was roughly around 132/80.  Advised patient to use a standard blood pressure cuff at home and record readings twice daily for the next 7 days and return them to [place].  Follow-up in 1 month for blood pressure recheck.  Sending out records request to [facility] physicians.

## 2022-11-01 NOTE — Patient Instructions (Addendum)
It was nice to see you today,  We addressed the following topics today: -Your right knee swelling is something called prepatellar bursitis.  This can be caused by an injury or fall.  It is a collection of fluid in your knee that we can have drained off.  I will send you to a sports medicine clinic to evaluate it and drain it.  After the treatment you should wear a compression sleeve on your knee. - Record your home blood pressures twice a day for 7 days and bring them back to [place].  I would prefer if you used a blood pressure cuff that you put around the bicep instead of the wrist.  They have these available at the pharmacies.  Omron is a reliable brand, but most the ones you can buy at the pharmacy are accurate. - Follow-up with [place] in 1 month to recheck your blood pressure and follow-up on the bursitis.  Have a great day,  Frederic Jericho, MD

## 2022-11-01 NOTE — Progress Notes (Signed)
New Patient Office Visit  Subjective    Patient ID: Kevin Mckenzie, male    DOB: [birth year]  Age: 37 y.o. MRN: [id number]  CC:  Chief Complaint  Patient presents with   Establish Care    HPI Kevin Mckenzie presents to establish care Patient was previously seeing [facility] physicians on [address].  Our location is closer to his home.  Patient was not being actively treated for any medical issues other than recently having prepatellar bursitis on the right knee.  Patient states he may have fallen prior to developing his bursitis.  Has never happened before.  Patient denies fever or chills.  No pain in the knee.  He had it drained once before at [facility] sports medicine.  Patient also complaining of high blood pressure readings at home.  He has a wrist cuff that he uses.  Today his blood pressure was normal on 2 readings.  He texted his wife and she replied that his readings were as high as 200/100 in the past.  Advised the patient to use a standard cuff instead of a wrist cuff.  Advised to can pick these up at the pharmacy and recommended a reliable brand.  Asked him to record his blood pressure readings at home and bring them back to [place].    Patient denies feelings of depression or anxiety.  Denies suicidal thoughts or ideation  PMH: no  PSH: no  FH: no  Tobacco use: no Alcohol use: no Drug use: no Marital status: married.  3 children,  16, 13, 11.   Employment: driver for [employer].    Outpatient Encounter Medications as of 11/01/2022  Medication Sig   acetaminophen (TYLENOL) 500 MG tablet Take 1,000 mg by mouth every 6 (six) hours as needed.   ibuprofen (ADVIL) 600 MG tablet Take 1 tablet (600 mg total) by mouth every 6 (six) hours as needed.   ipratropium (ATROVENT) 0.06 % nasal spray Place 2 sprays into both nostrils 4 (four) times daily.   promethazine-dextromethorphan (PROMETHAZINE-DM) 6.25-15 MG/5ML syrup Take 5 mLs by mouth 4 (four) times daily as needed for cough.    No facility-administered encounter medications on file as of 11/01/2022.    No past medical history on file.  No past surgical history on file.  No family history on file.  Social History   Socioeconomic History   Marital status: Married    Spouse name: Not on file   Number of children: Not on file   Years of education: Not on file   Highest education level: Not on file  Occupational History   Not on file  Tobacco Use   Smoking status: Never   Smokeless tobacco: Never  Vaping Use   Vaping status: Never Used  Substance and Sexual Activity   Alcohol use: No   Drug use: No   Sexual activity: Not on file  Other Topics Concern   Not on file  Social History Narrative   Not on file   Social Determinants of Health   Financial Resource Strain: Not on file  Food Insecurity: Not on file  Transportation Needs: Not on file  Physical Activity: Not on file  Stress: Not on file  Social Connections: Not on file  Intimate Partner Violence: Not on file    ROS      Objective    BP 136/72   Pulse 86   Temp 98.5 F (36.9 C) (Oral)   Ht 5\' 11"  (1.803 m)   Wt 299 lb  12 oz (136 kg)   SpO2 95%   BMI 41.81 kg/m   Physical Exam General: Alert, oriented HEENT: PERRLA, EOMI, will call this in CV: Regular rate and rhythm Pulmonary: Clear bilaterally GI: Soft, nontender. MSK: Enlarged swelling over the right patella.  Approximately the size of a tennis ball.  Nontender.  fluctuant.     Assessment & Plan:   Encounter to establish care  Prepatellar bursitis of right knee Assessment & Plan: Has had this drained in the past.  Based on chart review it seems as was done July 2023.  Nontender.  No evidence of infection.  Referred to sports medicine for drainage.  Patient will need to wear compression sleeve afterwards.  He has 1 from the last time, but never wore it.  By the time he obtains the sleeve the swelling had returned.  Orders: -     Ambulatory referral to Sports  Medicine  Blood pressure check Assessment & Plan: Patient states he takes his blood pressure on a wrist cuff at home and has had some high readings.  The readings he reported were 200/100 at the highest, but more in the 150s, 160s range.  We checked his blood pressure twice today and both times it was roughly around 132/80.  Advised patient to use a standard blood pressure cuff at home and record readings twice daily for the next 7 days and return them to [place].  Follow-up in 1 month for blood pressure recheck.  Sending out records request to [facility] physicians.   Positive screening for depression on 9-item Patient Health Questionnaire (PHQ-9) Assessment & Plan: Patient's PHQ-9 was all threes.  GAD-7 was all zeros.  Each questionnaire response was a straight line down the columns, (not individually circled).  Patient when asked did not endorse any depression or anxiety or suicidal thoughts.  Most likely the patient was not paying close attention to what he answered on the PHQ.    Flowsheet Row Office Visit from 11/01/2022 in [facility]  PHQ-9 Total Score 27        Return in about 4 weeks (around 11/29/2022) for Bursitis, blood pressure.   Kevin Kitty, MD

## 2022-11-01 NOTE — Assessment & Plan Note (Signed)
Patient's PHQ-9 was all threes.  GAD-7 was all zeros.  Each questionnaire response was a straight line down the columns, (not individually circled).  Patient when asked did not endorse any depression or anxiety or suicidal thoughts.  Most likely the patient was not paying close attention to what he answered on the PHQ.

## 2022-11-08 ENCOUNTER — Other Ambulatory Visit: Payer: Self-pay

## 2022-11-08 ENCOUNTER — Ambulatory Visit: Payer: BC Managed Care – PPO | Admitting: Family Medicine

## 2022-11-08 ENCOUNTER — Encounter: Payer: Self-pay | Admitting: Family Medicine

## 2022-11-08 VITALS — BP 151/94 | Ht 72.0 in | Wt 290.0 lb

## 2022-11-08 DIAGNOSIS — M7041 Prepatellar bursitis, right knee: Secondary | ICD-10-CM

## 2022-11-08 DIAGNOSIS — M25561 Pain in right knee: Secondary | ICD-10-CM

## 2022-11-08 MED ORDER — METHYLPREDNISOLONE ACETATE 40 MG/ML IJ SUSP: 40.0000 mg | Freq: Once | INTRAMUSCULAR | Status: AC

## 2022-11-08 NOTE — Progress Notes (Addendum)
PCP: Sandre Kitty, MD  Chief Complaint: Knee effusion Subjective:   HPI: Patient is a 37 y.o. male here for right sided knee effusion. Patient states that he had a knee effusion last July which he had drained. Patient was advised to get a knee sleeve and follow up with the physician at that time. Patient states that he was unable to go back due to other circumstances.  Patient is presenting today for the same effusion for the past 1 and half years.  Patient states that it is not painful and that there is no tenderness over the area.  Patient does not have any decreased range of motion.  Per patient, the effusion is about the same size as when he had originally drained in 2023 July.  Patient denies any tick bites or being outdoors a lot.  Patient is a Hospital doctor for [employer].  Patient denies any trauma at the original incidence but does note that he fell on his knee a few weeks ago but states that the effusion was already present prior to that.  History reviewed. No pertinent past medical history.  Current Outpatient Medications on File Prior to Visit  Medication Sig Dispense Refill   acetaminophen (TYLENOL) 500 MG tablet Take 1,000 mg by mouth every 6 (six) hours as needed.     ibuprofen (ADVIL) 600 MG tablet Take 1 tablet (600 mg total) by mouth every 6 (six) hours as needed. 30 tablet 0   ipratropium (ATROVENT) 0.06 % nasal spray Place 2 sprays into both nostrils 4 (four) times daily. 15 mL 0   promethazine-dextromethorphan (PROMETHAZINE-DM) 6.25-15 MG/5ML syrup Take 5 mLs by mouth 4 (four) times daily as needed for cough. 118 mL 0   No current facility-administered medications on file prior to visit.    History reviewed. No pertinent surgical history.  Allergies  Allergen Reactions   Penicillins     BP (!) 151/94   Ht 6' (1.829 m)   Wt 290 lb (131.5 kg)   BMI 39.33 kg/m       No data to display              No data to display              Objective:  Physical  Exam:  Gen: NAD, comfortable in exam room  Inspection reveals a significant sized effusion over the prepatellar area.  Effusion is large enough that most knee anatomical landmarks are difficult to discern.  There is no signs of erythema or infection.  There is no tenderness to palpation over the effusion itself or of the prepatellar area.  The effusion is able to be compressed with palpation.  Range of motion with flexion and extension is appropriate and strength is 5 out of 5.  Limited MSK Ultrasound Rt knee: Significant amount of loculated, anechoic fluid noted in the suprapatellar bursa, with possible connection to the suprapatellar pouch.  Multiple septations noted with some associated calcifications.  Depth of fluid is approx 4.5cm.  No increased doppler flow or hyperemia.   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 1. Prepatellar bursitis of right knee -  Discussed with patient that patient's effusion is likely chronic however will attempt to drain how much as possible today.  Ultrasound does show that this is likely a chronic effusion.  Aspiration was able to get approximately 40 cc of bloody aspirate out however there appears to be significant  amount of thick and chronic effusion that is still present.  Given the viscosity of the fluid, unable to drain anymore.  At this time will recommend patient wear his knee sleeve and follow-up with orthopedic surgery for evaluation for possible intervention - Will also send off fluid for laboratory testing as well as immunological testing for patient given chronic and recurrent nature of his condition -- Synovial fluid, cell count -- Body fluid culture -- Gram stain -- Lyme disease dna by pcr(borrelia burg) -- CBC -- ANA -- C-reactive protein -- Sed Rate (ESR) -- Anti-DNA antibody, double-stranded -- Lyme Disease Serology w/Reflex -- CYCLIC CITRUL PEPTIDE  ANTIBODY, IGG/IGA  - Ambulatory referral to Orthopedic Surgery for consideration of bursectomy given chronic nature of this condition and since large amount of inflammation and scarring noted on MSK U/S  2. Right knee pain, unspecified chronicity  - Synovial fluid, cell count - Body fluid culture - Gram stain - Lyme disease dna by pcr(borrelia burg) - Ambulatory referral to Orthopedic Surgery - CBC - ANA - C-reactive protein - Sed Rate (ESR) - Anti-DNA antibody, double-stranded - Lyme Disease Serology w/Reflex - CYCLIC CITRUL PEPTIDE ANTIBODY, IGG/IGA   Aspiration/Injection Procedure Note Kevin Mckenzie 1985/11/16  Procedure: U/S guided Large Joint Aspiration / Injection with synovial fluid aspiration of knee Indications: Effusion Rt  Procedure Details Patient consented with written consent; risks, benefits, and alternatives explained. Patient prepped with Chloraprep. Ethyl chloride for anesthesia. 3 cc of 1% Lidocaine used in wheal then injected subcutaneous fashion with 22 gauge needle on superior lateral approach. Under sterilne conditions and under ultrasound guidance 18 gauge needle used via lateral approach to aspirate 40 cc of bloody fluid that was sent for fluid analysis.  Area covered with adhesive bandage and post-procedure care reviewed.  Kevin Grills MD, PGY-4  Sports Medicine Fellow [facility]  Addendum:  Patient seen and examined in the office with fellow.   History, exam, plan of care were precepted with me.  I was present for entire procedure.  Kevin Lamer, DO, CAQSM

## 2022-11-08 NOTE — Patient Instructions (Signed)
Please go get your labs done   Today you received an anspiration without corticosteroid. This injection is usually done in response to pain and inflammation. There is some "numbing" medicine also in the shot so the injected area may be numb and feel really good for the next couple of hours. The numbing medicine usually wears off in 2-3 hours though, and then your pain level will be right back where it was before the injection.  Things to watch out for that you should contact us or a health care provider urgently would include: 1. Unusual (as in more than 10%) increase in pain 2. New fever > 101.5 3. New swelling or redness of the injected area. 4. Streaking of red lines around the area injected.

## 2022-11-10 LAB — SYNOVIAL FLUID, CELL COUNT
Eos, Fluid: 4 %
Lining Cells, Synovial: 0 %
Lymphs, Fluid: 68 %
Macrophages Fld: 0 %
Nuc cell # Fld: 2492 {cells}/uL — ABNORMAL HIGH (ref 0–200)
Polys, Fluid: 28 %
RBC, Fluid: >99999 /uL

## 2022-11-10 LAB — GRAM STAIN: Organism ID, Bacteria: NONE SEEN

## 2022-11-12 LAB — LYME DISEASE DNA BY PCR(BORRELIA BURG): Lyme (B. burgdorferi) PCR: NEGATIVE

## 2022-11-13 LAB — BODY FLUID CULTURE

## 2022-11-29 ENCOUNTER — Other Ambulatory Visit: Payer: Self-pay

## 2022-11-29 ENCOUNTER — Encounter: Payer: Self-pay | Admitting: Orthopedic Surgery

## 2022-11-29 ENCOUNTER — Ambulatory Visit (INDEPENDENT_AMBULATORY_CARE_PROVIDER_SITE_OTHER): Payer: BLUE CROSS/BLUE SHIELD | Admitting: Orthopedic Surgery

## 2022-11-29 ENCOUNTER — Other Ambulatory Visit (INDEPENDENT_AMBULATORY_CARE_PROVIDER_SITE_OTHER): Payer: Self-pay

## 2022-11-29 DIAGNOSIS — M7041 Prepatellar bursitis, right knee: Secondary | ICD-10-CM

## 2022-11-29 NOTE — Progress Notes (Unsigned)
Office Visit Note   Patient: Kevin Mckenzie           Date of Birth: 28-Dec-1985           MRN: [id number] Visit Date: 11/29/2022 Requested by: Brenton Grills, MD [address] [place],  [place] [address] PCP: Sandre Kitty, MD  Subjective: Chief Complaint  Patient presents with   Right Knee - Pain    HPI: Kevin Mckenzie is a 37 y.o. male who presents to the office reporting right knee prepatellar bursitis of 1 year duration.  Patient states that this has been aspirated about 6 months ago but then it came back about 1 to 2 days later.  Denies any history of injury.  No prior knee surgery.  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  He denies any fevers or chills.  Overall this is not bothering him too much clinically but the mass effect of essentially the softball on his patella can be aggravating at times..                ROS: All systems reviewed are negative as they relate to the chief complaint within the history of present illness.  Patient denies fevers or chills.  Assessment & Plan: Visit Diagnoses:  1. Prepatellar bursitis of right knee     Plan: Impression is nonseptic but large prepatellar bursitis fluid collection on the right knee.  Aspiration performed today and we got out 135 cc which is 3 completely full syringes.  Aspirate was bloody with some bursal soft tissue components visible on ultrasound examination.  The aspiration we did do under ultrasound guidance to maximize fluid extraction.  Recommendation was for surgical removal but that would require him keeping his leg in a knee immobilizer weightbearing as tolerated for at least a week.  Debbie's card provided.  He will consider his options.  Follow-Up Instructions: No follow-ups on file.   Orders:  Orders Placed This Encounter  Procedures   XR KNEE 3 VIEW RIGHT   US Guided Needle Placement - No Linked Charges   No orders of the defined types were placed in this encounter.     Procedures: Large Joint  Inj: R knee on 11/29/2022 4:25 PM Indications: diagnostic evaluation, joint swelling and pain Details: 18 G 1.5 in needle, ultrasound-guided superolateral approach  Arthrogram: No  Medications: 5 mL lidocaine 1 %; 4 mL bupivacaine 0.25 % Aspirate: 120 mL bloody Outcome: tolerated well, no immediate complications  Large prepatellar bursa the size of a softball aspirated.  Not intra-articular. Procedure, treatment alternatives, risks and benefits explained, specific risks discussed. Consent was given by the patient. Immediately prior to procedure a time out was called to verify the correct patient, procedure, equipment, support staff and site/side marked as required. Patient was prepped and draped in the usual sterile fashion.       Clinical Data: No additional findings.  Objective: Vital Signs: There were no vitals taken for this visit.  Physical Exam:  Constitutional: Patient appears well-developed HEENT:  Head: Normocephalic Eyes:EOM are normal Neck: Normal range of motion Cardiovascular: Normal rate Pulmonary/chest: Effort normal Neurologic: Patient is alert Skin: Skin is warm Psychiatric: Patient has normal mood and affect  Ortho Exam:  Ortho exam demonstrates softball size mass on the anterior aspect of his knee.  Not warm and there is no induration or erythema around it.  No proximal lymphadenopathy.  No right knee effusion.  Collateral crucial ligaments are stable.  Range of motion is  full.  Pedal pulses palpable.  The cystic mass has ballotable fluid throughout the mass by palpation.  Specialty Comments:  No specialty comments available.  Imaging: US Guided Needle Placement - No Linked Charges  Result Date: 11/29/2022 Ultrasound imaging demonstrates needle placement into the prepatellar bursa with aspiration and decompression of fluid-filled bursal sac and no complicating features    PMFS History: Patient Active Problem List   Diagnosis Date Noted    Prepatellar bursitis of right knee 11/01/2022   Blood pressure check 11/01/2022   Positive screening for depression on 9-item Patient Health Questionnaire (PHQ-9) 11/01/2022   History reviewed. No pertinent past medical history.  History reviewed. No pertinent family history.  History reviewed. No pertinent surgical history. Social History   Occupational History   Not on file  Tobacco Use   Smoking status: Never   Smokeless tobacco: Never  Vaping Use   Vaping status: Never Used  Substance and Sexual Activity   Alcohol use: No   Drug use: No   Sexual activity: Not on file

## 2022-11-30 MED ORDER — LIDOCAINE HCL 1 % IJ SOLN
5.0000 mL | INTRAMUSCULAR | Status: AC | PRN
  Administered 2022-11-29: 5 mL

## 2022-11-30 MED ORDER — BUPIVACAINE HCL 0.25 % IJ SOLN
4.0000 mL | INTRAMUSCULAR | Status: AC | PRN
  Administered 2022-11-29: 4 mL via INTRA_ARTICULAR

## 2022-12-20 ENCOUNTER — Ambulatory Visit: Payer: Self-pay | Admitting: Family Medicine

## 2023-03-18 ENCOUNTER — Telehealth: Payer: Self-pay | Admitting: Orthopedic Surgery

## 2023-03-18 NOTE — Telephone Encounter (Signed)
 Patient called. Would like to go ahead with surgery.

## 2023-03-19 ENCOUNTER — Telehealth: Payer: Self-pay | Admitting: Orthopedic Surgery

## 2023-03-19 NOTE — Telephone Encounter (Signed)
 Done thx can you have him send pic thru chart

## 2023-03-19 NOTE — Telephone Encounter (Signed)
 Left message on patient's voicemail asking him to return call to discuss surgery date for right knee bursa excision.  Left name and direct number to schedule.

## 2023-03-20 NOTE — Telephone Encounter (Signed)
 Patient does not have mychart. Tried calling him to get him to email me photo. No answer. LMVM

## 2023-04-19 IMAGING — CR DG KNEE COMPLETE 4+V*R*
4 series · 4 of 4 positions shown · non-contrast
Comparison: None Available.

CLINICAL DATA: Prepatellar bursitis, knee pain

EXAM:
RIGHT KNEE - COMPLETE 4+ VIEW

[w knee ap right]
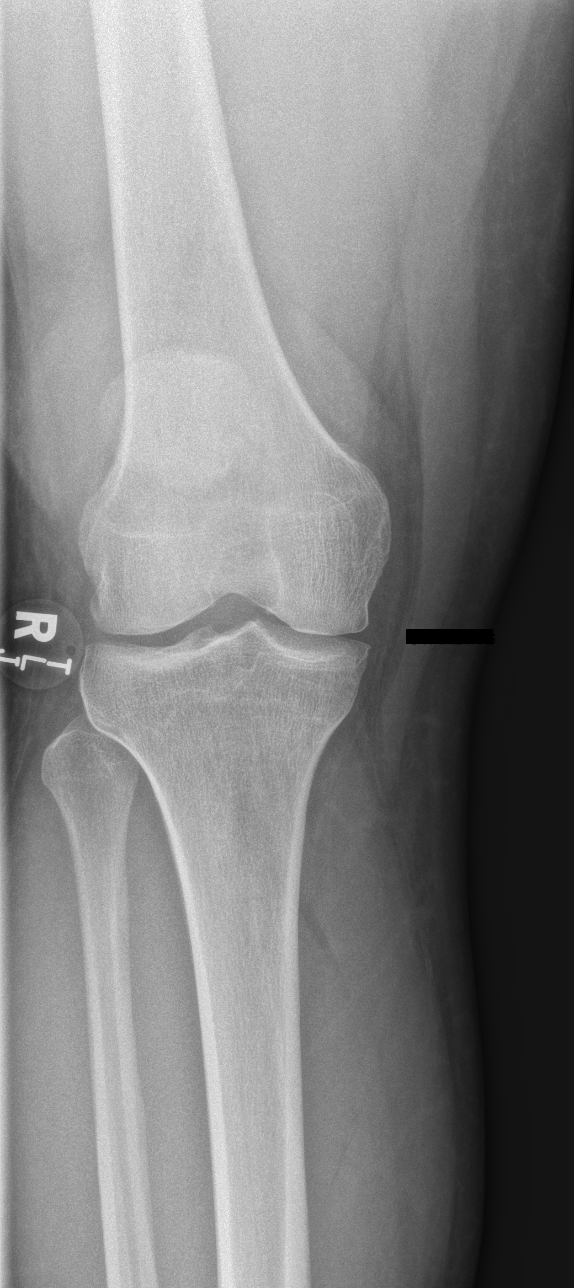

[w knee lat right]
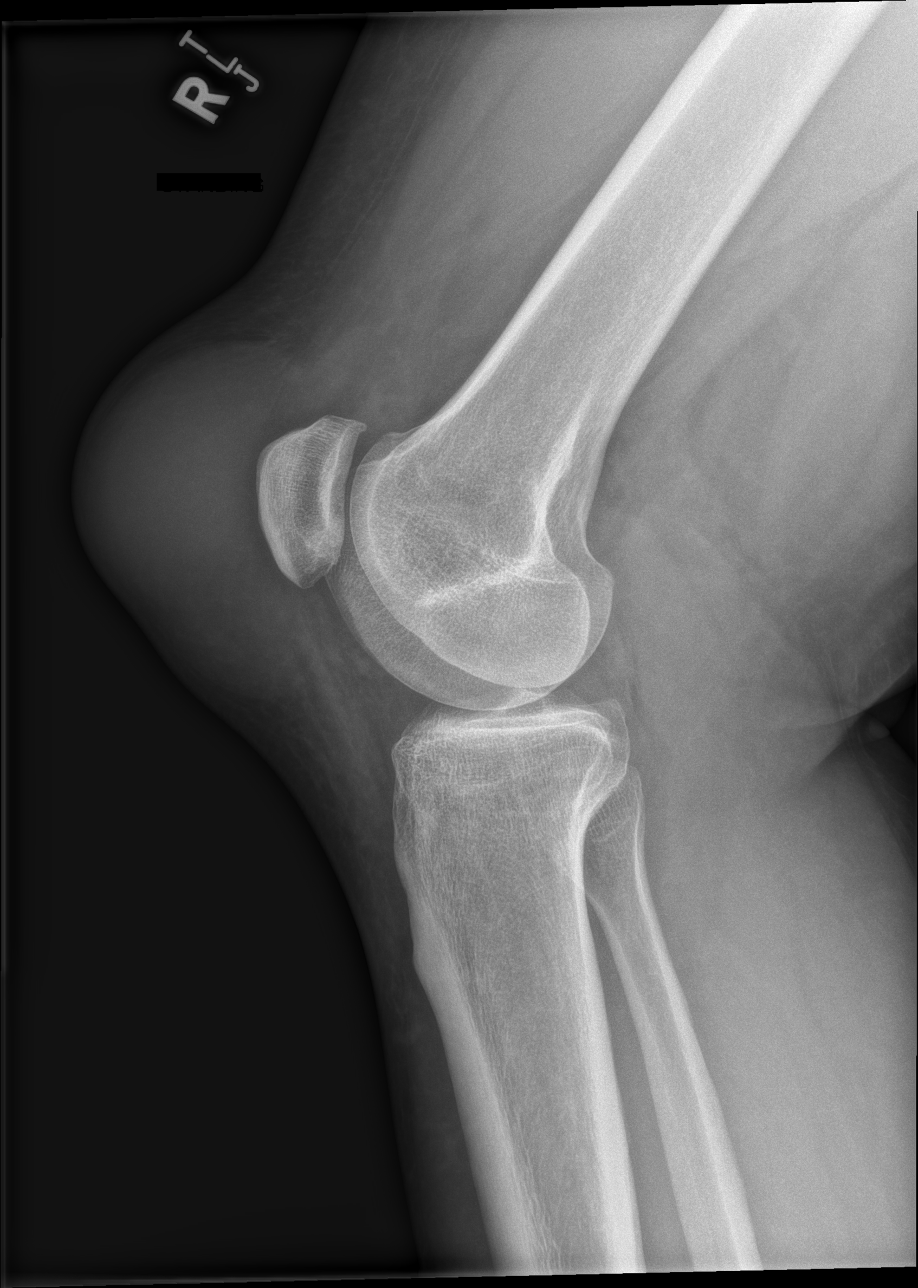

[w knee tunnel pa right]
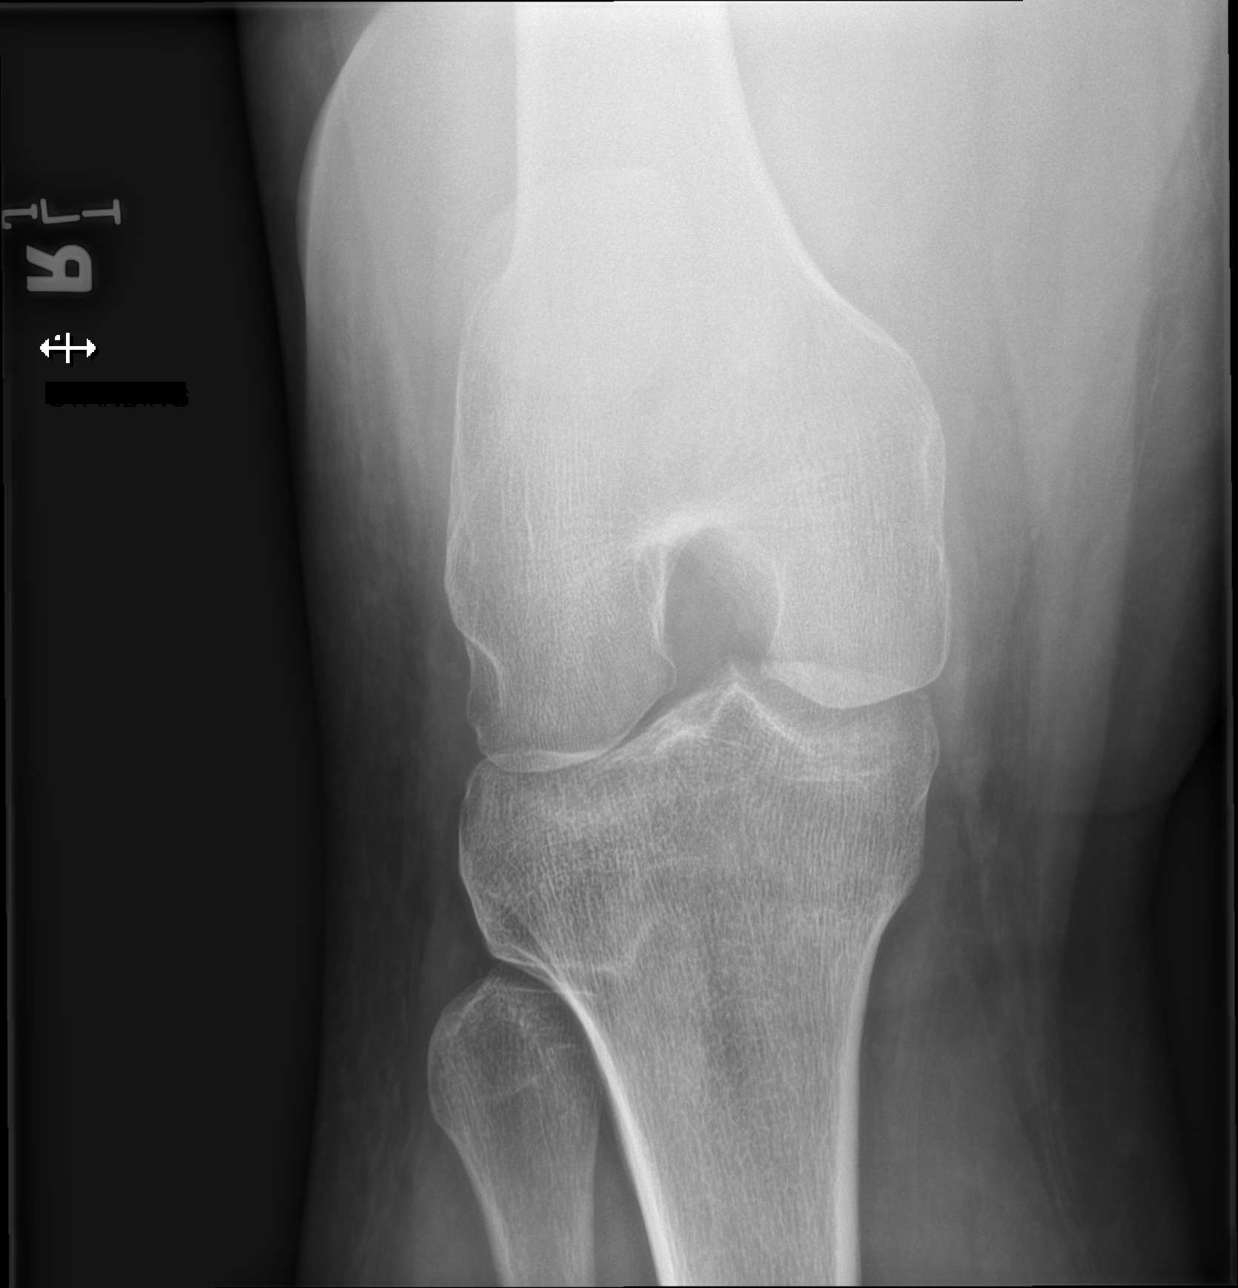

[x knee sunrise right]
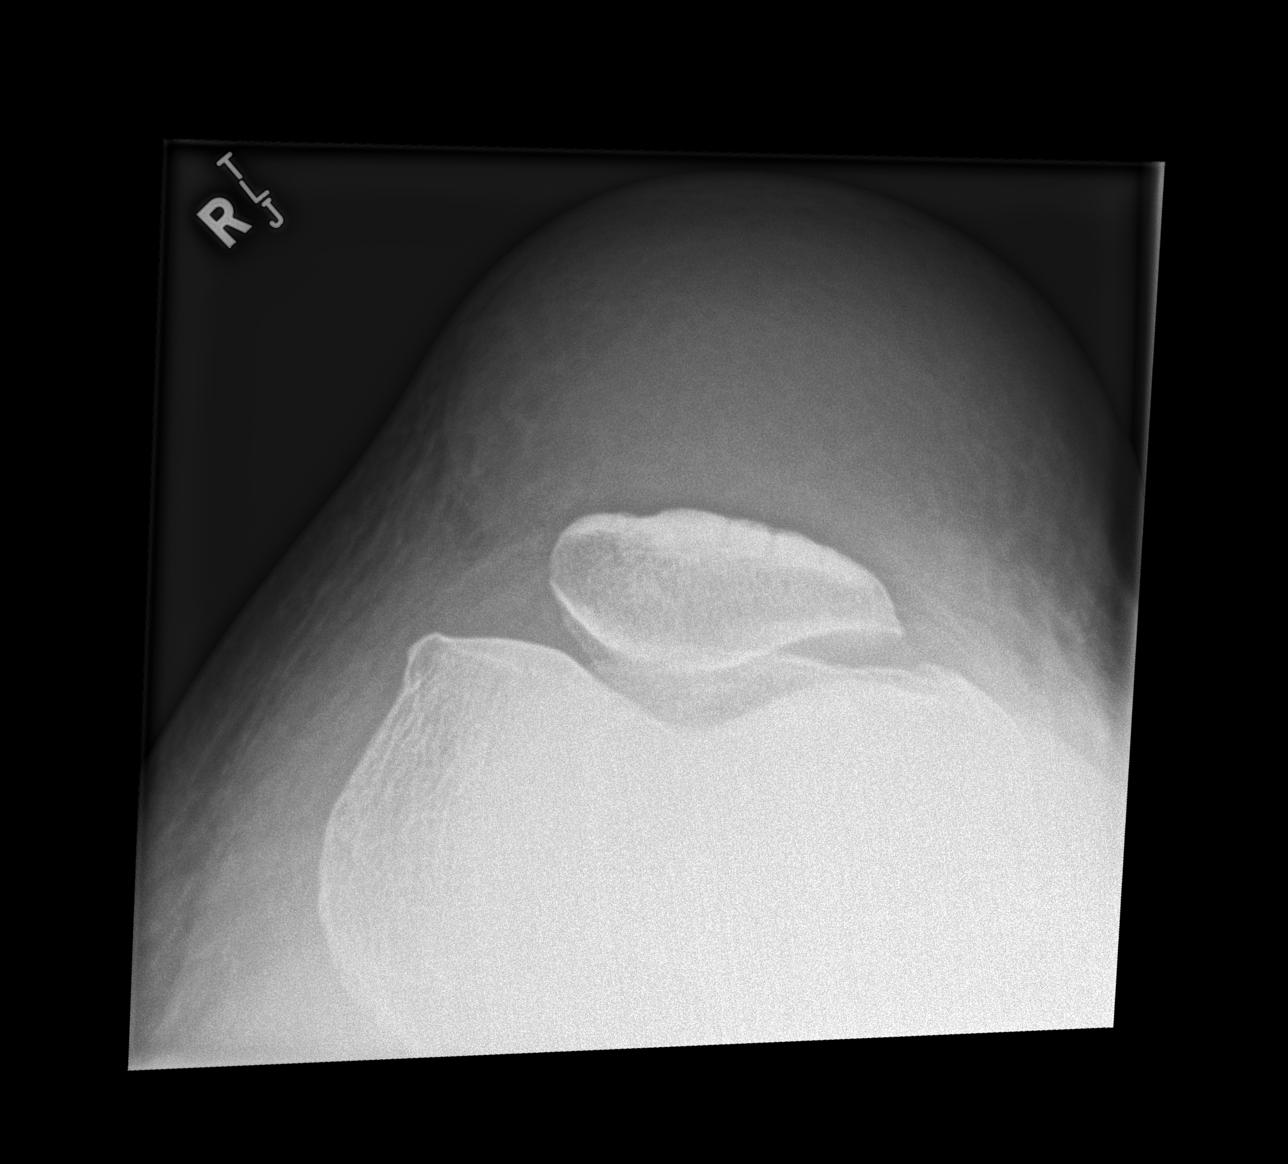

[4 of 4 positions shown; findings below may reference images not displayed]

FINDINGS: No acute fracture or dislocation identified. Mild narrowing of the
patellofemoral joint and medial compartment with early marginal
osteophytes. Small suprapatellar density likely represents joint
effusion. Marked soft tissue swelling and increased density anterior
to the patella. No radiopaque foreign body identified.
IMPRESSION: 1. Marked soft tissue swelling and increased density anterior to the
patella, likely representing prepatellar bursitis.
2. Mild degenerative changes of the knee and suspected small joint
effusion.

## 2023-04-22 ENCOUNTER — Encounter (HOSPITAL_COMMUNITY): Payer: Self-pay | Admitting: Anesthesiology

## 2023-04-28 DIAGNOSIS — Z01818 Encounter for other preprocedural examination: Secondary | ICD-10-CM

## 2023-05-05 ENCOUNTER — Encounter: Admitting: Surgical

## 2023-05-05 ENCOUNTER — Encounter (HOSPITAL_COMMUNITY): Payer: Self-pay | Admitting: Orthopedic Surgery

## 2023-05-05 ENCOUNTER — Telehealth: Payer: Self-pay | Admitting: Orthopedic Surgery

## 2023-05-05 ENCOUNTER — Telehealth: Payer: Self-pay

## 2023-05-05 NOTE — Telephone Encounter (Signed)
 Patient needs to reschedule his surgery that was posted for tomorrow, 05/08/2023 due to death in his family.  [name] is going to try to get him rescheduled to 05/08.

## 2023-05-05 NOTE — Telephone Encounter (Signed)
 Patient unable to keep surgery May 28, 2023 due to death in the family.  Patient will call to reschedule at a later date.

## 2023-05-05 NOTE — Progress Notes (Signed)
 Called patient for his upcoming surgery with Dr Rozelle Corning on Tuesday, 05/06/23 at 2:20 PM.  Patient states he is cancelling this procedure due to death in the family.  I gave patient Dr Eliberto Grosser office number to inform them of his desire to cancel procedure.  Patient to call MD when he is ready to reschedule surgery.  I also called MD's office to inform them of patient's desire to cancel procedure.

## 2023-05-06 ENCOUNTER — Ambulatory Visit (HOSPITAL_COMMUNITY): Admission: RE | Admit: 2023-05-06 | Source: Home / Self Care | Admitting: Orthopedic Surgery

## 2023-05-06 DIAGNOSIS — Z01818 Encounter for other preprocedural examination: Secondary | ICD-10-CM

## 2023-05-06 SURGERY — BURSECTOMY, KNEE
Anesthesia: General | Site: Knee | Laterality: Right

## 2023-05-06 NOTE — Telephone Encounter (Signed)
 Ok thx.

## 2023-05-14 ENCOUNTER — Encounter: Admitting: Orthopedic Surgery

## 2023-05-16 ENCOUNTER — Encounter: Admitting: Orthopedic Surgery

## 2023-07-03 ENCOUNTER — Telehealth: Payer: Self-pay | Admitting: Orthopedic Surgery

## 2023-07-03 NOTE — Telephone Encounter (Signed)
 Patient called to reschedule his appointment that he missed from April (post op) states his mother had passed and he wasn't able to keep any past appointments.   No appts available until July 18th for [facility] & July 21st for Addie. Is it okay fr pt to wait until then or could we get him in sooner.     Pt's call back number [phone]

## 2023-07-04 NOTE — Telephone Encounter (Signed)
 I called patient. He requests Tuesday or Friday appt. Put on Dr. Brion schedule for 07/25/2023.

## 2023-07-04 NOTE — Telephone Encounter (Signed)
 He is pursuing nonurgent elective surgery so I think it is okay to wait till then but if he really wants to be seen sooner, he can be added onto 7/3

## 2023-07-25 ENCOUNTER — Encounter: Payer: Self-pay | Admitting: Orthopedic Surgery

## 2023-07-25 ENCOUNTER — Ambulatory Visit: Admitting: Orthopedic Surgery

## 2023-07-25 ENCOUNTER — Telehealth: Payer: Self-pay

## 2023-07-25 DIAGNOSIS — M7041 Prepatellar bursitis, right knee: Secondary | ICD-10-CM | POA: Diagnosis not present

## 2023-07-25 NOTE — Telephone Encounter (Signed)
 Patient saw Dr Addie and is ready to proceed with rescheduling surgery for right knee bursa excision. Patient ready to get it done ASAP please call [phone] to reschedule

## 2023-07-25 NOTE — Progress Notes (Signed)
 Office Visit Note   Patient: Kevin Mckenzie           Date of Birth: 1985/04/01           MRN: [id number] Visit Date: 07/25/2023 Requested by: Chandra Toribio POUR, MD [address] [place],  [place] [address] PCP: Chandra Toribio POUR, MD  Subjective: Chief Complaint  Patient presents with   Right Knee - Follow-up    HPI: Kevin Mckenzie is a 39 y.o. male who presents to the office reporting right knee prepatellar bursa swelling.  He was scheduled for surgery earlier this year but had to cancel because of family deaths.  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  He has had this fluid aspirated but it recurred.  No calcification of the soft tissues on plain radiographs..                ROS: All systems reviewed are negative as they relate to the chief complaint within the history of present illness.  Patient denies fevers or chills.  Assessment & Plan: Visit Diagnoses:  1. Prepatellar bursitis of right knee     Plan: Impression is significant prepatellar bursal fluid collection in the right knee.  Plan is prepatellar bursa excision.  Essentially with the size of this for 4 cavity.  Has a defect of tissue expander in the right knee.  We will have to perform some skin excision in order to achieve meaningful closure.  Also plan on suturing the subdermal tissue to the fascia overlying the extensor mechanism.  The risk and benefits of surgery are discussed with the patient including but limited to infection and vessel damage incomplete pain relief as well as potential recurrence of the cyst.  Plan to send that to pathology for examination.  All questions answered.  Follow-Up Instructions: No follow-ups on file.   Orders:  No orders of the defined types were placed in this encounter.  No orders of the defined types were placed in this encounter.     Procedures: No procedures performed   Clinical Data: No additional  findings.  Objective: Vital Signs: There were no vitals taken for this visit.  Physical Exam:  Constitutional: Patient appears well-developed HEENT:  Head: Normocephalic Eyes:EOM are normal Neck: Normal range of motion Cardiovascular: Normal rate Pulmonary/chest: Effort normal Neurologic: Patient is alert Skin: Skin is warm Psychiatric: Patient has normal mood and affect  Ortho Exam: Ortho exam demonstrates softball size prepatellar bursal cyst on the anterior aspect of the knee.  No proximal lymphadenopathy.  No effusion in the knee.  Collateral patient ligaments are stable.  Pedal pulses palpable.  No warmth to the knee.  The cyst is fluid-filled and not solid.   Specialty Comments:  No specialty comments available.  Imaging: No results found.   PMFS History: Patient Active Problem List   Diagnosis Date Noted   Prepatellar bursitis of right knee 11/01/2022   Blood pressure check 11/01/2022   Positive screening for depression on 9-item Patient Health Questionnaire (PHQ-9) 11/01/2022   No past medical history on file.  No family history on file.  No past surgical history on file. Social History   Occupational History   Not on file  Tobacco Use   Smoking status: Never   Smokeless tobacco: Never  Vaping Use   Vaping status: Never Used  Substance and Sexual Activity   Alcohol use: No   Drug use:  No   Sexual activity: Not on file

## 2023-08-08 ENCOUNTER — Encounter (HOSPITAL_COMMUNITY): Payer: Self-pay | Admitting: Orthopedic Surgery

## 2023-08-08 ENCOUNTER — Other Ambulatory Visit: Payer: Self-pay

## 2023-08-08 NOTE — Progress Notes (Signed)
 SDW call  Patient was given pre-op instructions over the phone. Patient verbalized understanding of instructions provided.     PCP - Dr. Teretha Slain Cardiologist - denies Pulmonary:    PPM/ICD - denies Device Orders - na Rep Notified - na   Chest x-ray - na EKG -  na Stress Test - ECHO -  Cardiac Cath -   Sleep Study/sleep apnea/CPAP: denies  Non-diabetic  Blood Thinner Instructions: denies Aspirin Instructions:denies   ERAS Protcol - Clears until 1315   Anesthesia review: No   Patient denies shortness of breath, fever, cough and chest pain over the phone call  Your procedure is scheduled on Tuesday August 12, 2023  Report to [facility] Main Entrance A at  1345 PM, then check in with the Admitting office.  Call this number if you have problems the morning of surgery:  [phone]   If you have any questions prior to your surgery date call [phone]: Open Monday-Friday 8am-4pm If you experience any cold or flu symptoms such as cough, fever, chills, shortness of breath, etc. between now and your scheduled surgery, please notify us  at the above number    Remember:  Do not eat after midnight the night before your surgery  You may drink clear liquids until   1315 the day of your surgery.   Clear liquids allowed are: Water, Non-Citrus Juices (without pulp), Carbonated Beverages, Clear Tea, Black Coffee ONLY (NO MILK, CREAM OR POWDERED CREAMER of any kind), and Gatorade   Take these medicines if needed the morning of surgery with A SIP OF WATER:  Tylenol , promethazine -DM, atrovent  nasal spray  As of today, STOP taking any Aspirin (unless otherwise instructed by your surgeon) Aleve , Naproxen , Ibuprofen , Motrin , Advil , Goody's, BC's, all herbal medications, fish oil, and all vitamins.

## 2023-08-12 ENCOUNTER — Ambulatory Visit (HOSPITAL_COMMUNITY)
Admission: RE | Admit: 2023-08-12 | Discharge: 2023-08-12 | Disposition: A | Discharge status: Home / Self Care | Attending: Orthopedic Surgery | Admitting: Orthopedic Surgery

## 2023-08-12 ENCOUNTER — Ambulatory Visit (HOSPITAL_COMMUNITY)

## 2023-08-12 ENCOUNTER — Other Ambulatory Visit: Payer: Self-pay

## 2023-08-12 ENCOUNTER — Encounter (HOSPITAL_COMMUNITY): Payer: Self-pay | Admitting: Orthopedic Surgery

## 2023-08-12 ENCOUNTER — Ambulatory Visit (HOSPITAL_COMMUNITY)
Admission: RE | Disposition: A | Discharge status: Home / Self Care | Payer: Self-pay | Source: Home / Self Care | Attending: Orthopedic Surgery

## 2023-08-12 DIAGNOSIS — M7041 Prepatellar bursitis, right knee: Secondary | ICD-10-CM

## 2023-08-12 DIAGNOSIS — Z01818 Encounter for other preprocedural examination: Secondary | ICD-10-CM

## 2023-08-12 HISTORY — PX: KNEE BURSECTOMY: SHX5882

## 2023-08-12 LAB — CBC
HCT: 44.1 % (ref 39.0–52.0)
Hemoglobin: 13.5 g/dL (ref 13.0–17.0)
MCH: 25.5 pg — ABNORMAL LOW (ref 26.0–34.0)
MCHC: 30.6 g/dL (ref 30.0–36.0)
MCV: 83.4 fL (ref 80.0–100.0)
Platelets: 299 K/uL (ref 150–400)
RBC: 5.29 MIL/uL (ref 4.22–5.81)
RDW: 17.1 % — ABNORMAL HIGH (ref 11.5–15.5)
WBC: 5.3 K/uL (ref 4.0–10.5)
nRBC: 0 % (ref 0.0–0.2)

## 2023-08-12 SURGERY — BURSECTOMY, KNEE
Anesthesia: General | Site: Knee | Laterality: Right

## 2023-08-12 MED ORDER — AMISULPRIDE (ANTIEMETIC) 5 MG/2ML IV SOLN: 10.0000 mg | Freq: Once | INTRAVENOUS | Status: DC | PRN

## 2023-08-12 MED ORDER — MORPHINE SULFATE 4 MG/ML IJ SOLN
INTRAVENOUS | Status: DC | PRN
  Administered 2023-08-12: 33 mL via INTRAMUSCULAR

## 2023-08-12 MED ORDER — POVIDONE-IODINE 7.5 % EX SOLN: Freq: Once | CUTANEOUS | Status: DC

## 2023-08-12 MED ORDER — PROPOFOL 10 MG/ML IV BOLUS
INTRAVENOUS | Status: DC | PRN
  Administered 2023-08-12: 200 mg via INTRAVENOUS

## 2023-08-12 MED ORDER — LIDOCAINE 2% (20 MG/ML) 5 ML SYRINGE
INTRAMUSCULAR | Status: AC
  Filled 2023-08-12: qty 5

## 2023-08-12 MED ORDER — OXYCODONE HCL 5 MG/5ML PO SOLN: 5.0000 mg | Freq: Once | ORAL | Status: DC | PRN

## 2023-08-12 MED ORDER — FENTANYL CITRATE PF 50 MCG/ML IJ SOSY: 50.0000 ug | PREFILLED_SYRINGE | Freq: Once | INTRAMUSCULAR | Status: DC

## 2023-08-12 MED ORDER — ONDANSETRON HCL 4 MG/2ML IJ SOLN
INTRAMUSCULAR | Status: AC
  Filled 2023-08-12: qty 2

## 2023-08-12 MED ORDER — ONDANSETRON HCL 4 MG/2ML IJ SOLN
INTRAMUSCULAR | Status: DC | PRN
  Administered 2023-08-12: 4 mg via INTRAVENOUS

## 2023-08-12 MED ORDER — MORPHINE SULFATE (PF) 4 MG/ML IV SOLN
INTRAVENOUS | Status: AC
  Filled 2023-08-12: qty 1

## 2023-08-12 MED ORDER — FENTANYL CITRATE (PF) 100 MCG/2ML IJ SOLN
INTRAMUSCULAR | Status: AC
  Administered 2023-08-12: 50 ug
  Filled 2023-08-12: qty 2

## 2023-08-12 MED ORDER — MIDAZOLAM HCL 2 MG/2ML IJ SOLN: 2.0000 mg | Freq: Once | INTRAMUSCULAR | Status: AC

## 2023-08-12 MED ORDER — CLONIDINE HCL (ANALGESIA) 100 MCG/ML EP SOLN
EPIDURAL | Status: DC | PRN
  Administered 2023-08-12: 100 ug

## 2023-08-12 MED ORDER — HYDROMORPHONE HCL 1 MG/ML IJ SOLN: 0.2500 mg | INTRAMUSCULAR | Status: DC | PRN

## 2023-08-12 MED ORDER — CLONIDINE HCL (ANALGESIA) 100 MCG/ML EP SOLN
EPIDURAL | Status: AC
  Filled 2023-08-12: qty 10

## 2023-08-12 MED ORDER — POVIDONE-IODINE 10 % EX SWAB
2.0000 | Freq: Once | CUTANEOUS | Status: AC
  Administered 2023-08-12: 2 via TOPICAL

## 2023-08-12 MED ORDER — CELECOXIB 100 MG PO CAPS: 100.0000 mg | ORAL_CAPSULE | Freq: Two times a day (BID) | ORAL | 0 refills | Status: DC

## 2023-08-12 MED ORDER — OXYCODONE HCL 5 MG PO TABS: 5.0000 mg | ORAL_TABLET | Freq: Once | ORAL | Status: DC | PRN

## 2023-08-12 MED ORDER — CEFAZOLIN SODIUM-DEXTROSE 3-4 GM/150ML-% IV SOLN
3.0000 g | INTRAVENOUS | Status: AC
  Administered 2023-08-12: 3 g via INTRAVENOUS
  Filled 2023-08-12: qty 150

## 2023-08-12 MED ORDER — BUPIVACAINE-EPINEPHRINE (PF) 0.25% -1:200000 IJ SOLN
INTRAMUSCULAR | Status: AC
  Filled 2023-08-12: qty 30

## 2023-08-12 MED ORDER — METHOCARBAMOL 500 MG PO TABS
500.0000 mg | ORAL_TABLET | Freq: Three times a day (TID) | ORAL | 1 refills | Status: AC | PRN
Start: 2023-08-12 — End: ?

## 2023-08-12 MED ORDER — DEXMEDETOMIDINE HCL IN NACL 80 MCG/20ML IV SOLN
INTRAVENOUS | Status: DC | PRN
  Administered 2023-08-12: 8 ug via INTRAVENOUS

## 2023-08-12 MED ORDER — ACETAMINOPHEN 10 MG/ML IV SOLN: 1000.0000 mg | Freq: Once | INTRAVENOUS | Status: DC | PRN

## 2023-08-12 MED ORDER — ACETAMINOPHEN 500 MG PO TABS: 1000.0000 mg | ORAL_TABLET | Freq: Once | ORAL | Status: DC

## 2023-08-12 MED ORDER — ORAL CARE MOUTH RINSE: 15.0000 mL | Freq: Once | OROMUCOSAL | Status: AC

## 2023-08-12 MED ORDER — MIDAZOLAM HCL 2 MG/2ML IJ SOLN
INTRAMUSCULAR | Status: AC
  Administered 2023-08-12: 2 mg via INTRAVENOUS
  Filled 2023-08-12: qty 2

## 2023-08-12 MED ORDER — OXYCODONE HCL 5 MG PO TABS: 5.0000 mg | ORAL_TABLET | Freq: Four times a day (QID) | ORAL | 0 refills | Status: AC | PRN

## 2023-08-12 MED ORDER — TRANEXAMIC ACID-NACL 1000-0.7 MG/100ML-% IV SOLN
INTRAVENOUS | Status: AC
  Filled 2023-08-12: qty 100

## 2023-08-12 MED ORDER — 0.9 % SODIUM CHLORIDE (POUR BTL) OPTIME
TOPICAL | Status: DC | PRN
  Administered 2023-08-12: 1000 mL

## 2023-08-12 MED ORDER — PHENYLEPHRINE HCL (PRESSORS) 10 MG/ML IV SOLN
INTRAVENOUS | Status: DC | PRN
Start: 2023-08-12 — End: 2023-08-12
  Administered 2023-08-12: 160 ug via INTRAVENOUS
  Administered 2023-08-12: 80 ug via INTRAVENOUS
  Administered 2023-08-12: 160 ug via INTRAVENOUS
  Administered 2023-08-12 (×2): 80 ug via INTRAVENOUS
  Administered 2023-08-12 (×2): 160 ug via INTRAVENOUS

## 2023-08-12 MED ORDER — PROPOFOL 10 MG/ML IV BOLUS
INTRAVENOUS | Status: AC
  Filled 2023-08-12: qty 20

## 2023-08-12 MED ORDER — ASPIRIN 81 MG PO CHEW: 81.0000 mg | CHEWABLE_TABLET | Freq: Two times a day (BID) | ORAL | 0 refills | Status: AC

## 2023-08-12 MED ORDER — ALBUMIN HUMAN 5 % IV SOLN: INTRAVENOUS | Status: DC | PRN

## 2023-08-12 MED ORDER — MORPHINE SULFATE (PF) 4 MG/ML IV SOLN
INTRAVENOUS | Status: AC
  Filled 2023-08-12: qty 2

## 2023-08-12 MED ORDER — CHLORHEXIDINE GLUCONATE 0.12 % MT SOLN
OROMUCOSAL | Status: AC
  Administered 2023-08-12: 15 mL via OROMUCOSAL
  Filled 2023-08-12: qty 15

## 2023-08-12 MED ORDER — ONDANSETRON HCL 4 MG/2ML IJ SOLN: 4.0000 mg | Freq: Once | INTRAMUSCULAR | Status: DC | PRN

## 2023-08-12 MED ORDER — CHLORHEXIDINE GLUCONATE 0.12 % MT SOLN: 15.0000 mL | Freq: Once | OROMUCOSAL | Status: AC

## 2023-08-12 MED ORDER — PHENYLEPHRINE 80 MCG/ML (10ML) SYRINGE FOR IV PUSH (FOR BLOOD PRESSURE SUPPORT)
PREFILLED_SYRINGE | INTRAVENOUS | Status: AC
  Filled 2023-08-12: qty 10

## 2023-08-12 MED ORDER — BUPIVACAINE HCL (PF) 0.25 % IJ SOLN
INTRAMUSCULAR | Status: AC
  Filled 2023-08-12: qty 30

## 2023-08-12 MED ORDER — TRANEXAMIC ACID-NACL 1000-0.7 MG/100ML-% IV SOLN
INTRAVENOUS | Status: DC | PRN
  Administered 2023-08-12: 1000 mg via INTRAVENOUS

## 2023-08-12 MED ORDER — LIDOCAINE 2% (20 MG/ML) 5 ML SYRINGE
INTRAMUSCULAR | Status: DC | PRN
  Administered 2023-08-12: 100 mg via INTRAVENOUS

## 2023-08-12 MED ORDER — LACTATED RINGERS IV SOLN: INTRAVENOUS | Status: DC

## 2023-08-12 SURGICAL SUPPLY — 51 items
ALCOHOL 70% 16 OZ (MISCELLANEOUS) ×1 IMPLANT
BAG COUNTER SPONGE SURGICOUNT (BAG) ×1 IMPLANT
BNDG COHESIVE 4X5 TAN STRL LF (GAUZE/BANDAGES/DRESSINGS) IMPLANT
BNDG ELASTIC 4X5.8 VLCR STR LF (GAUZE/BANDAGES/DRESSINGS) IMPLANT
BNDG GAUZE DERMACEA FLUFF 4 (GAUZE/BANDAGES/DRESSINGS) IMPLANT
COVER SURGICAL LIGHT HANDLE (MISCELLANEOUS) ×1 IMPLANT
CUFF TOURN DUAL PORT 4IN 42 (TOURNIQUET CUFF) IMPLANT
CUFF TOURN SGL QUICK 18X4 (TOURNIQUET CUFF) IMPLANT
CUFF TOURN SGL QUICK 42 (TOURNIQUET CUFF) IMPLANT
CUFF TRNQT CYL 24X4X16.5-23 (TOURNIQUET CUFF) IMPLANT
CUFF TRNQT CYL 34X4.125X (TOURNIQUET CUFF) IMPLANT
DRAPE U-SHAPE 47X51 STRL (DRAPES) ×1 IMPLANT
DRSG AQUACEL AG ADV 3.5X10 (GAUZE/BANDAGES/DRESSINGS) IMPLANT
DURAPREP 26ML APPLICATOR (WOUND CARE) ×1 IMPLANT
ELECTRODE REM PT RTRN 9FT ADLT (ELECTROSURGICAL) ×1 IMPLANT
GAUZE PAD ABD 8X10 STRL (GAUZE/BANDAGES/DRESSINGS) IMPLANT
GAUZE SPONGE 4X4 12PLY STRL (GAUZE/BANDAGES/DRESSINGS) IMPLANT
GAUZE XEROFORM 5X9 LF (GAUZE/BANDAGES/DRESSINGS) IMPLANT
GLOVE BIOGEL PI IND STRL 7.0 (GLOVE) ×1 IMPLANT
GLOVE BIOGEL PI IND STRL 8 (GLOVE) ×1 IMPLANT
GLOVE ECLIPSE 7.0 STRL STRAW (GLOVE) ×1 IMPLANT
GLOVE ECLIPSE 8.0 STRL XLNG CF (GLOVE) ×1 IMPLANT
GOWN STRL REUS W/ TWL LRG LVL3 (GOWN DISPOSABLE) ×2 IMPLANT
GOWN STRL REUS W/ TWL XL LVL3 (GOWN DISPOSABLE) ×1 IMPLANT
IMMOBILIZER KNEE 24 THIGH 36 (SOFTGOODS) IMPLANT
KIT BASIN OR (CUSTOM PROCEDURE TRAY) ×1 IMPLANT
KIT TURNOVER KIT B (KITS) ×1 IMPLANT
MANIFOLD NEPTUNE II (INSTRUMENTS) ×1 IMPLANT
NS IRRIG 1000ML POUR BTL (IV SOLUTION) ×1 IMPLANT
PACK ORTHO EXTREMITY (CUSTOM PROCEDURE TRAY) ×1 IMPLANT
PAD ARMBOARD POSITIONER FOAM (MISCELLANEOUS) ×2 IMPLANT
PAD CAST 4YDX4 CTTN HI CHSV (CAST SUPPLIES) IMPLANT
SET HNDPC FAN SPRY TIP SCT (DISPOSABLE) IMPLANT
SPONGE T-LAP 18X18 ~~LOC~~+RFID (SPONGE) IMPLANT
SPONGE T-LAP 4X18 ~~LOC~~+RFID (SPONGE) IMPLANT
STOCKINETTE IMPERVIOUS 9X36 MD (GAUZE/BANDAGES/DRESSINGS) IMPLANT
STRIP CLOSURE SKIN 1/2X4 (GAUZE/BANDAGES/DRESSINGS) IMPLANT
SUT ETHILON 2 0 FS 18 (SUTURE) IMPLANT
SUT ETHILON 3 0 PS 1 (SUTURE) IMPLANT
SUT MNCRL AB 3-0 PS2 27 (SUTURE) IMPLANT
SUT VIC AB 0 CT1 36 (SUTURE) IMPLANT
SUT VIC AB 2-0 CT2 27 (SUTURE) IMPLANT
SUT VIC AB 3-0 SH 27X BRD (SUTURE) IMPLANT
SWAB CULTURE ESWAB REG 1ML (MISCELLANEOUS) IMPLANT
SWAB CULTURE LIQ STUART DBL (MISCELLANEOUS) IMPLANT
TOWEL GREEN STERILE (TOWEL DISPOSABLE) ×1 IMPLANT
TOWEL GREEN STERILE FF (TOWEL DISPOSABLE) ×1 IMPLANT
TUBE CONNECTING 12X1/4 (SUCTIONS) ×1 IMPLANT
UNDERPAD 30X36 HEAVY ABSORB (UNDERPADS AND DIAPERS) ×1 IMPLANT
WATER STERILE IRR 1000ML POUR (IV SOLUTION) ×1 IMPLANT
YANKAUER SUCT BULB TIP NO VENT (SUCTIONS) ×1 IMPLANT

## 2023-08-12 NOTE — Anesthesia Procedure Notes (Signed)
 Procedure Name: Intubation Date/Time: 08/12/2023 3:22 PM  Performed by: Hedy Jarred, CRNAPre-anesthesia Checklist: Patient identified, Emergency Drugs available, Suction available and Patient being monitored Patient Re-evaluated:Patient Re-evaluated prior to induction Oxygen Delivery Method: Circle System Utilized Preoxygenation: Pre-oxygenation with 100% oxygen Induction Type: IV induction Ventilation: Mask ventilation without difficulty LMA: LMA inserted LMA Size: 4.0 Tube type: Oral Number of attempts: 1 Airway Equipment and Method: Stylet and Oral airway Placement Confirmation: ETT inserted through vocal cords under direct vision, positive ETCO2 and breath sounds checked- equal and bilateral Tube secured with: Tape Dental Injury: Teeth and Oropharynx as per pre-operative assessment

## 2023-08-12 NOTE — Anesthesia Preprocedure Evaluation (Addendum)
 Anesthesia Evaluation  Patient identified by MRN, date of birth, ID band Patient awake    Reviewed: Allergy & Precautions, NPO status , Patient's Chart, lab work & pertinent test results  Airway Mallampati: III  TM Distance: >3 FB Neck ROM: Full    Dental no notable dental hx. (+) Teeth Intact, Dental Advisory Given   Pulmonary sleep apnea    Pulmonary exam normal breath sounds clear to auscultation       Cardiovascular (-) hypertension(-) angina (-) Past MI Normal cardiovascular exam Rhythm:Regular Rate:Normal     Neuro/Psych  negative psych ROS   GI/Hepatic negative GI ROS, Neg liver ROS,,,  Endo/Other    Class 3 obesity  Renal/GU negative Renal ROS     Musculoskeletal negative musculoskeletal ROS (+)    Abdominal  (+) + obese  Peds  Hematology Lab Results      Component                Value               Date                      WBC                      5.3                 08/12/2023                HGB                      13.5                08/12/2023                HCT                      44.1                08/12/2023                MCV                      83.4                08/12/2023                PLT                      299                 08/12/2023              Anesthesia Other Findings All: Pcn  Reproductive/Obstetrics                              Anesthesia Physical Anesthesia Plan  ASA: 3  Anesthesia Plan: General   Post-op Pain Management: Regional block*, Minimal or no pain anticipated, Tylenol  PO (pre-op)* and Precedex    Induction:   PONV Risk Score and Plan: 3 and Treatment may vary due to age or medical condition, Ondansetron , Dexamethasone  and Midazolam   Airway Management Planned: LMA  Additional Equipment: None  Intra-op Plan:   Post-operative Plan: Extubation in OR  Informed Consent: I have reviewed the patients History and Physical, chart, labs  and discussed the procedure  including the risks, benefits and alternatives for the proposed anesthesia with the patient or authorized representative who has indicated his/her understanding and acceptance.     Dental advisory given  Plan Discussed with: CRNA and Surgeon  Anesthesia Plan Comments:          Anesthesia Quick Evaluation

## 2023-08-12 NOTE — Transfer of Care (Signed)
 Immediate Anesthesia Transfer of Care Note  Patient: Kevin Mckenzie  Procedure(s) Performed: BURSECTOMY, KNEE, RIGHT (Right: Knee)  Patient Location: PACU  Anesthesia Type:General  Level of Consciousness: drowsy  Airway & Oxygen Therapy: Patient Spontanous Breathing  Post-op Assessment: Report given to RN and Post -op Vital signs reviewed and stable  Post vital signs: Reviewed and stable  Last Vitals:  Vitals Value Taken Time  BP 139/94 08/12/23 17:30  Temp 36.5 C 08/12/23 17:22  Pulse 95 08/12/23 17:43  Resp 16 08/12/23 17:43  SpO2 88 % 08/12/23 17:43  Vitals shown include unfiled device data.  Last Pain:  Vitals:   08/12/23 1722  TempSrc:   PainSc: Asleep         Complications: No notable events documented.

## 2023-08-12 NOTE — Anesthesia Procedure Notes (Signed)
 Anesthesia Regional Block: Adductor canal block   Pre-Anesthetic Checklist: , timeout performed,  Correct Patient, Correct Site, Correct Laterality,  Correct Procedure, Correct Position, site marked,  Risks and benefits discussed,  Surgical consent,  Pre-op evaluation,  At surgeon's request and post-op pain management  Laterality: Lower and Right  Prep: chloraprep       Needles:  Injection technique: Single-shot  Needle Type: Echogenic Needle     Needle Length: 9cm  Needle Gauge: 22     Additional Needles:   Procedures:,,,, ultrasound used (permanent image in chart),,    Narrative:  Start time: 08/12/2023 2:37 PM End time: 08/12/2023 2:45 PM Injection made incrementally with aspirations every 5 mL.  Performed by: Personally  Anesthesiologist: Jefm Garnette LABOR, MD  Additional Notes: Block assessed prior to surgery. Pt tolerated procedure well.

## 2023-08-12 NOTE — Anesthesia Postprocedure Evaluation (Signed)
 Anesthesia Post Note  Patient: Kevin Mckenzie  Procedure(s) Performed: BURSECTOMY, KNEE, RIGHT (Right: Knee)     Patient location during evaluation: PACU Anesthesia Type: General Level of consciousness: awake and alert Pain management: pain level controlled Vital Signs Assessment: post-procedure vital signs reviewed and stable Respiratory status: spontaneous breathing, nonlabored ventilation, respiratory function stable and patient connected to nasal cannula oxygen Cardiovascular status: blood pressure returned to baseline and stable Postop Assessment: no apparent nausea or vomiting Anesthetic complications: no   No notable events documented.  Last Vitals:  Vitals:   08/12/23 1800 08/12/23 1815  BP: 132/85 128/83  Pulse: 81 81  Resp: 14 20  Temp:  36.5 C  SpO2: 94% 92%    Last Pain:  Vitals:   08/12/23 1815  TempSrc:   PainSc: 0-No pain                 Kevin Mckenzie

## 2023-08-12 NOTE — Brief Op Note (Signed)
   08/12/2023  5:24 PM  PATIENT:  Kevin Mckenzie  38 y.o. male  PRE-OPERATIVE DIAGNOSIS:  right knee prepatellar bursitis  POST-OPERATIVE DIAGNOSIS:  right knee prepatellar bursitis  PROCEDURE:  Procedure(s): BURSECTOMY, KNEE, RIGHT  SURGEON:  Surgeon(s): Addie, Cordella Hamilton, MD  ASSISTANT: magnant pa  ANESTHESIA:   general  EBL: 25 ml    Total I/O In: 100 [IV Piggyback:100] Out: -   BLOOD ADMINISTERED: none  DRAINS: none   LOCAL MEDICATIONS USED:  none  SPECIMEN: cyst to path  COUNTS:  YES  TOURNIQUET:   Total Tourniquet Time Documented: Thigh (Right) - 24 minutes Total: Thigh (Right) - 24 minutes   DICTATION: .Other Dictation: Dictation Number [id number]  PLAN OF CARE: Discharge to home after PACU  PATIENT DISPOSITION:  PACU - hemodynamically stable

## 2023-08-12 NOTE — H&P (Signed)
 Kevin Mckenzie is an 38 y.o. male.   Chief Complaint: Right knee fluid HPI:  Kevin Mckenzie is a 38 y.o. male who presents  reporting right knee prepatellar bursa swelling.  He was scheduled for surgery earlier this year but had to cancel because of family deaths.  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  He has had this fluid aspirated but it recurred.  No calcification of the soft tissues on plain radiographs.   History reviewed. No pertinent past medical history.  Past Surgical History:  Procedure Laterality Date   NO PAST SURGERIES      History reviewed. No pertinent family history. Social History:  reports that he has never smoked. He has never used smokeless tobacco. He reports that he does not drink alcohol and does not use drugs.  Allergies:  Allergies  Allergen Reactions   Penicillins Other (See Comments)    All Cillins/Could not walk    Facility-Administered Medications Prior to Admission  Medication Dose Route Frequency Provider Last Rate Last Admin   methylPREDNISolone  acetate (DEPO-MEDROL ) injection 40 mg  40 mg Intra-articular Once Jha, Panav, MD       Medications Prior to Admission  Medication Sig Dispense Refill   cetirizine (ZYRTEC) 10 MG tablet Take 10 mg by mouth daily as needed for allergies.      Results for orders placed or performed during the hospital encounter of 08/12/23 (from the past 48 hours)  CBC     Status: Abnormal   Collection Time: 08/12/23  1:47 PM  Result Value Ref Range   WBC 5.3 4.0 - 10.5 K/uL   RBC 5.29 4.22 - 5.81 MIL/uL   Hemoglobin 13.5 13.0 - 17.0 g/dL   HCT 55.8 60.9 - 47.9 %   MCV 83.4 80.0 - 100.0 fL   MCH 25.5 (L) 26.0 - 34.0 pg   MCHC 30.6 30.0 - 36.0 g/dL   RDW 82.8 (H) 88.4 - 84.4 %   Platelets 299 150 - 400 K/uL   nRBC 0.0 0.0 - 0.2 %    Comment: Performed at [facility], [address] [place], [place] [address]   No results found.  Review  of Systems  Musculoskeletal:  Positive for arthralgias.  All other systems reviewed and are negative.   Blood pressure (!) 166/92, pulse 68, temperature 98.2 F (36.8 C), temperature source Oral, resp. rate 19, height 6' (1.829 m), weight 133.8 kg, SpO2 96%. Physical Exam Vitals reviewed.  HENT:     Head: Normocephalic.     Nose: Nose normal.     Mouth/Throat:     Mouth: Mucous membranes are moist.  Eyes:     Pupils: Pupils are equal, round, and reactive to light.  Cardiovascular:     Rate and Rhythm: Normal rate.     Pulses: Normal pulses.  Pulmonary:     Effort: Pulmonary effort is normal.  Musculoskeletal:     Cervical back: Normal range of motion.  Skin:    General: Skin is warm.     Capillary Refill: Capillary refill takes less than 2 seconds.  Neurological:     General: No focal deficit present.     Mental Status: He is alert.   Ortho exam demonstrates softball size prepatellar bursal cyst on the anterior aspect of the knee.  No proximal lymphadenopathy.  No effusion in the knee.  Collateral patient ligaments are stable.  Pedal  pulses palpable.  No warmth to the knee.  The cyst is fluid-filled and not solid.    Assessment/Plan Impression is significant prepatellar bursal fluid collection in the right knee. Plan is prepatellar bursa excision. Essentially with the size of this bursal cavity he Has a  tissue expander in the right knee. We will have to perform some skin excision in order to achieve meaningful closure. Also plan on suturing the subdermal tissue to the fascia overlying the extensor mechanism. The risk and benefits of surgery are discussed with the patient including but limited to infection and vessel damage incomplete pain relief as well as potential recurrence of the cyst. Plan to send that to pathology for examination. All questions answered.   KANDICE Glendia Hutchinson, MD 08/12/2023, 2:41 PM

## 2023-08-13 ENCOUNTER — Encounter (HOSPITAL_COMMUNITY): Payer: Self-pay | Admitting: Orthopedic Surgery

## 2023-08-13 NOTE — Op Note (Unsigned)
 [name], [name] MEDICAL RECORD NO: [id number] ACCOUNT NO: [id number] DATE OF BIRTH: Oct 25, 1985 FACILITY: [facility] LOCATION: [facility] PHYSICIAN: Cordella RAMAN. Addie, MD  Operative Report   DATE OF PROCEDURE: 08/12/2023   PREOPERATIVE DIAGNOSIS: Right knee prepatellar bursal cyst.  POSTOPERATIVE DIAGNOSIS: Right knee prepatellar bursal cyst.  PROCEDURE: Right knee prepatellar bursal cyst excision.  SURGEON: Cordella RAMAN. Addie, MD.  ASSISTANT: Herlene Calix.  INDICATIONS: The patient is a 38 year old with a softball-sized cystic structure on his knee, which has been aspirated x2 with recurrence. He presents now for operative management after explanation of risks and benefits.  DESCRIPTION OF PROCEDURE: The patient was brought to the operating room where general anesthetic was induced. Preoperative antibiotics were administered. A timeout was called. Right leg prescrubbed with alcohol and Betadine . Allowed to air dry, prepped  with DuraPrep solution and draped in a sterile manner. The leg was elevated and exsanguinated with Esmarch wrap. The tourniquet was inflated. An incision was made about 10 cm and an oval ellipse was made around the central portion of the large cystic  mass. Soft tissue dissection was performed medial and lateral to the mass. With traction and countertraction, we were able to dissect away the cystic mass from the underlying fascia. The cystic structure was sent en bloc to pathology for analysis. The  resulting incision was then thoroughly irrigated. Skin edges were anesthetized using a combination of Marcaine , morphine , and clonidine . Next, the tourniquet was released and meticulous hemostasis was achieved. At this time, we used 2-0 and 3-0 Vicryl to  close down the dead space between the subdermal tissue and the fascia. Excess skin was removed. The skin was then closed using 2-0 Vicryl and 3-0 Monocryl. Steri-Strips and Aquacel dressing, Ace wrap, and knee immobilizer placed.  The patient tolerated  the procedure well without immediate complications. He was transferred to the recovery room in stable condition. Luke's assistance was required for opening and closing mobilization of tissue. His assistance was of medical necessity.    SUJ D: 08/12/2023 5:29:03 pm T: 08/13/2023 6:43:00 am  JOB: [id number]/ [id number]

## 2023-08-14 ENCOUNTER — Ambulatory Visit: Payer: Self-pay | Admitting: Orthopedic Surgery

## 2023-08-14 LAB — SURGICAL PATHOLOGY

## 2023-08-14 NOTE — Progress Notes (Signed)
 path

## 2023-08-15 MED FILL — Clonidine HCl Inj (For Epidural Infusion) 100 MCG/ML: EPIDURAL | Qty: 1 | Status: AC

## 2023-08-20 ENCOUNTER — Ambulatory Visit (INDEPENDENT_AMBULATORY_CARE_PROVIDER_SITE_OTHER): Admitting: Surgical

## 2023-08-20 ENCOUNTER — Encounter: Payer: Self-pay | Admitting: Surgical

## 2023-08-20 ENCOUNTER — Encounter: Admitting: Orthopedic Surgery

## 2023-08-20 DIAGNOSIS — M7041 Prepatellar bursitis, right knee: Secondary | ICD-10-CM

## 2023-08-20 NOTE — Progress Notes (Signed)
   Post-Op Visit Note   Patient: Kevin Mckenzie           Date of Birth: 30-Aug-1985           MRN: [id number] Visit Date: 08/20/2023 PCP: Chandra Toribio POUR, MD   Assessment & Plan:  Chief Complaint:  Chief Complaint  Patient presents with   Right Knee - Routine Post Op    08/12/23 right knee bursectomy   Visit Diagnoses: No diagnosis found.  Plan: Patient is a 38 year old male who presents s/p right knee prepatellar bursectomy on 08/12/2023.  Overall he is doing well with no complaint of pain.  No fevers or chills.  No drainage from the incision.  He is ambulating without assistance.  No chest pain or shortness of breath or calf pain.  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  On exam, patient has incision that is healing well without any evidence of infection or dehiscence.  No calf tenderness.  Negative Homans' sign.  Palpable DP pulse.  He is able to perform straight leg raise multiple times without extensor lag.  No evidence of recurrence of fluid in the bursal space.  Range of motion from 0 degrees extension to about 90 degrees of knee flexion quite easily.  Plan at this time is hold off on range of motion past 60 degrees for now.  He can increase to 90 degrees over the next 3 weeks in order to ease tension on the incision.  No flexion past 90 until his follow-up appointment.  Okay to perform straight leg raises and return to work.  Did inform him of his pathology results which just demonstrated chronic bursitis.  Follow-up in 3 weeks for likely final check.  He will hold off on any running, jumping, athletics.  Most of what he wants to get back to doing is fishing.  Follow-Up Instructions: No follow-ups on file.   Orders:  No orders of the defined types were placed in this encounter.  No orders of the defined types were placed in this encounter.   Imaging: No results found.  PMFS History: Patient Active Problem List    Diagnosis Date Noted   Prepatellar bursitis of right knee 11/01/2022   Blood pressure check 11/01/2022   Positive screening for depression on 9-item Patient Health Questionnaire (PHQ-9) 11/01/2022   No past medical history on file.  No family history on file.  Past Surgical History:  Procedure Laterality Date   KNEE BURSECTOMY Right 08/12/2023   Procedure: BURSECTOMY, KNEE, RIGHT;  Surgeon: Addie Cordella Hamilton, MD;  Location: [facility] OR;  Service: Orthopedics;  Laterality: Right;  RIGHT KNEE PREPATELLAR BURSA EXCISION   NO PAST SURGERIES     Social History   Occupational History   Not on file  Tobacco Use   Smoking status: Never   Smokeless tobacco: Never  Vaping Use   Vaping status: Never Used  Substance and Sexual Activity   Alcohol use: No   Drug use: No   Sexual activity: Not on file

## 2023-08-22 ENCOUNTER — Encounter: Admitting: Surgical

## 2023-09-02 ENCOUNTER — Encounter: Payer: Self-pay | Admitting: Family Medicine

## 2023-09-02 ENCOUNTER — Encounter: Admitting: Family Medicine

## 2023-09-08 ENCOUNTER — Other Ambulatory Visit: Payer: Self-pay | Admitting: Surgical

## 2023-09-19 ENCOUNTER — Encounter: Payer: Self-pay | Admitting: Surgical

## 2023-09-19 ENCOUNTER — Ambulatory Visit: Admitting: Surgical

## 2023-09-19 DIAGNOSIS — M7041 Prepatellar bursitis, right knee: Secondary | ICD-10-CM

## 2023-09-19 NOTE — Progress Notes (Signed)
   Post-Op Visit Note   Patient: Kevin Mckenzie           Date of Birth: [birth year]           MRN: [id number] Visit Date: 09/19/2023 PCP: Chandra Toribio POUR, MD   Assessment & Plan:  Chief Complaint:  Chief Complaint  Patient presents with   Right Knee - Routine Post Op   Visit Diagnoses:  1. Prepatellar bursitis of right knee     Plan: Patient is a 38 year old male who presents s/p right knee prepatellar bursectomy on 08/12/2023.  Overall he is doing well.  He has returned to work in full capacity.  Feels almost back to normal.  Does not take any medications for pain.  No fluid recurrence.  No drainage from his incision.  On exam, there is no effusion.  No fluctuance or fluid noted in the prepatellar region.  No calf tenderness.  Is able to perform straight leg raise without extensor lag.  He has range of motion from 0 degrees extension to greater than 90 degrees of knee flexion quite easily.  Incision is healing well with some hypertrophic scar formation.  Counseled patient that if this becomes painful for him, he could consider seeing dermatology for scar injections.  Plan at this time is have patient follow-up as needed.  He is doing very well and no restrictions at this time.  Follow-Up Instructions: No follow-ups on file.   Orders:  No orders of the defined types were placed in this encounter.  No orders of the defined types were placed in this encounter.   Imaging: No results found.  PMFS History: Patient Active Problem List   Diagnosis Date Noted   Prepatellar bursitis of right knee 11/01/2022   Blood pressure check 11/01/2022   Positive screening for depression on 9-item Patient Health Questionnaire (PHQ-9) 11/01/2022   No past medical history on file.  No family history on file.  Past Surgical History:  Procedure Laterality Date   KNEE BURSECTOMY Right 08/12/2023   Procedure: BURSECTOMY, KNEE, RIGHT;  Surgeon: Addie Cordella Hamilton, MD;  Location: [facility] OR;  Service:  Orthopedics;  Laterality: Right;  RIGHT KNEE PREPATELLAR BURSA EXCISION   NO PAST SURGERIES     Social History   Occupational History   Not on file  Tobacco Use   Smoking status: Never   Smokeless tobacco: Never  Vaping Use   Vaping status: Never Used  Substance and Sexual Activity   Alcohol use: No   Drug use: No   Sexual activity: Not on file

## 2023-11-10 ENCOUNTER — Encounter: Payer: Self-pay | Admitting: Radiology

## 2024-01-15 ENCOUNTER — Encounter: Payer: Self-pay | Admitting: Family Medicine

## 2024-01-15 ENCOUNTER — Ambulatory Visit (INDEPENDENT_AMBULATORY_CARE_PROVIDER_SITE_OTHER): Admitting: Family Medicine

## 2024-01-15 VITALS — BP 138/78 | HR 91 | Ht 72.0 in | Wt 296.8 lb

## 2024-01-15 DIAGNOSIS — Z0000 Encounter for general adult medical examination without abnormal findings: Secondary | ICD-10-CM | POA: Diagnosis not present

## 2024-01-15 DIAGNOSIS — R030 Elevated blood-pressure reading, without diagnosis of hypertension: Secondary | ICD-10-CM | POA: Diagnosis not present

## 2024-01-15 DIAGNOSIS — M7041 Prepatellar bursitis, right knee: Secondary | ICD-10-CM | POA: Diagnosis not present

## 2024-01-15 NOTE — Assessment & Plan Note (Signed)
 Routine health maintenance discussed, including the need for regular monitoring of blood pressure and lifestyle modifications to manage weight and blood pressure. - Ordered routine blood tests including A1c, cholesterol, blood sugar, kidney and liver function tests. - Scheduled follow-up appointment in six months for blood pressure monitoring.

## 2024-01-15 NOTE — Assessment & Plan Note (Signed)
 Surgical removal of bursa was performed in 2025.  No issues since that time.

## 2024-01-15 NOTE — Assessment & Plan Note (Signed)
 He is implementing lifestyle changes, including intermittent fasting and regular exercise, to manage weight. - Continue lifestyle modifications including intermittent fasting and regular exercise.

## 2024-01-15 NOTE — Assessment & Plan Note (Signed)
 Previous home readings were high, but in-office readings were normal. Discussed potential initiation of medication if blood pressure remains elevated. Explained side effects of antihypertensive medications. Emphasized importance of monitoring blood pressure at home. Discussed risks of untreated hypertension. - Ordered blood tests including kidney function and potassium levels. - If blood pressure remains elevated, will consider starting antihypertensive medication after reviewing lab results. - Advised obtaining a reliable blood pressure monitor, preferably [employer], for home use. - Scheduled follow-up in six months to monitor blood pressure.

## 2024-01-15 NOTE — Progress Notes (Signed)
 "  Established Patient Office Visit  Subjective   Patient ID: Kevin Mckenzie, male    DOB: January 18, 1985  Age: 39 y.o. MRN: [id number]  Chief Complaint  Patient presents with   Annual Exam     History of Present Illness   LONDEN BOK is a 39 year old male who presents for an annual physical exam and requests an A1c check.  He is interested in checking his A1c, cholesterol, blood sugar, kidney, and liver function tests as part of his routine health maintenance.   He has started a new diet involving intermittent fasting, not eating after a certain time, and trying to have at least two meals a day. His long work hours, from early morning until late at night, impact his eating schedule.  Since his last visit, he had a bursa removed from his knee. No other joint pain or issues around the knee.  He has a history of high blood pressure readings at home, although they were normal during his last visit. He does not currently take any medications for blood pressure. He uses a wrist blood pressure monitor at home, which may not be the most accurate for him.      The ASCVD Risk score (Arnett DK, et al., 2019) failed to calculate for the following reasons:   The 2019 ASCVD risk score is only valid for ages 74 to 70   * - Cholesterol units were assumed  Health Maintenance Due  Topic Date Due   HIV Screening  Never done   Hepatitis C Screening  Never done   Hepatitis B Vaccines 19-59 Average Risk (1 of 3 - 19+ 3-dose series) Never done   HPV VACCINES (1 - Risk 3-dose SCDM series) Never done   COVID-19 Vaccine (1 - 2025-26 season) Never done      Objective:     BP 138/78   Pulse 91   Ht 6' (1.829 m)   Wt 296 lb 12.8 oz (134.6 kg)   SpO2 95%   BMI 40.25 kg/m    Physical Exam     Gen: alert, oriented HEENT: perrla, eomi, mmm.  Nasal congestion.   CV: rrr, no murmur Pulm: lctab. No wheeze or crackles.  GI: soft, nbs.  Nontender to palpation MSK: strength equal b/l. Normal  gait Ext: no pedal edema Skin: warm and dry, no rashes Psych: pleasant affect.  Spontaneous speech       No results found for any visits on 01/15/24.      Assessment & Plan:   Physical exam, annual  Morbid obesity (HCC) Assessment & Plan: He is implementing lifestyle changes, including intermittent fasting and regular exercise, to manage weight. - Continue lifestyle modifications including intermittent fasting and regular exercise.  Orders: -     Comprehensive metabolic panel with GFR -     CBC with Differential/Platelet -     Lipid panel -     TSH -     Hemoglobin A1c  Elevated BP reading w/ no diagnosis of HTN Assessment & Plan: Previous home readings were high, but in-office readings were normal. Discussed potential initiation of medication if blood pressure remains elevated. Explained side effects of antihypertensive medications. Emphasized importance of monitoring blood pressure at home. Discussed risks of untreated hypertension. - Ordered blood tests including kidney function and potassium levels. - If blood pressure remains elevated, will consider starting antihypertensive medication after reviewing lab results. - Advised obtaining a reliable blood pressure monitor, preferably [employer], for home use. -  Scheduled follow-up in six months to monitor blood pressure.  Orders: -     Comprehensive metabolic panel with GFR -     CBC with Differential/Platelet -     TSH  Prepatellar bursitis of right knee Assessment & Plan: Surgical removal of bursa was performed in 2025.  No issues since that time.    Healthcare maintenance Assessment & Plan: Routine health maintenance discussed, including the need for regular monitoring of blood pressure and lifestyle modifications to manage weight and blood pressure. - Ordered routine blood tests including A1c, cholesterol, blood sugar, kidney and liver function tests. - Scheduled follow-up appointment in six months for blood pressure  monitoring.        Return in about 6 months (around 07/14/2024) for HTN.    Toribio MARLA Slain, MD  "

## 2024-01-15 NOTE — Patient Instructions (Signed)
 It was nice to see you today,  We addressed the following topics today: - I will let you know after your test results what medication we will start for blood pressure  Have a great day,  Rolan Slain, MD

## 2024-01-16 ENCOUNTER — Ambulatory Visit: Payer: Self-pay | Admitting: Family Medicine

## 2024-01-16 LAB — CBC WITH DIFFERENTIAL/PLATELET
Basophils Absolute: 0 x10E3/uL (ref 0.0–0.2)
Basos: 1 %
EOS (ABSOLUTE): 0.3 x10E3/uL (ref 0.0–0.4)
Eos: 5 %
Hematocrit: 43.4 % (ref 37.5–51.0)
Hemoglobin: 13.9 g/dL (ref 13.0–17.7)
Immature Grans (Abs): 0 x10E3/uL (ref 0.0–0.1)
Immature Granulocytes: 0 %
Lymphocytes Absolute: 1.9 x10E3/uL (ref 0.7–3.1)
Lymphs: 32 %
MCH: 26.4 pg — ABNORMAL LOW (ref 26.6–33.0)
MCHC: 32 g/dL (ref 31.5–35.7)
MCV: 82 fL (ref 79–97)
Monocytes Absolute: 1.1 x10E3/uL — ABNORMAL HIGH (ref 0.1–0.9)
Monocytes: 18 %
Neutrophils Absolute: 2.6 x10E3/uL (ref 1.4–7.0)
Neutrophils: 44 %
Platelets: 278 x10E3/uL (ref 150–450)
RBC: 5.27 x10E6/uL (ref 4.14–5.80)
RDW: 16.1 % — ABNORMAL HIGH (ref 11.6–15.4)
WBC: 6 x10E3/uL (ref 3.4–10.8)

## 2024-01-16 LAB — COMPREHENSIVE METABOLIC PANEL WITH GFR
ALT: 36 IU/L (ref 0–44)
AST: 22 IU/L (ref 0–40)
Albumin: 4.4 g/dL (ref 4.1–5.1)
Alkaline Phosphatase: 144 IU/L — ABNORMAL HIGH (ref 47–123)
BUN/Creatinine Ratio: 14 (ref 9–20)
BUN: 14 mg/dL (ref 6–20)
Bilirubin Total: 0.4 mg/dL (ref 0.0–1.2)
CO2: 23 mmol/L (ref 20–29)
Calcium: 9.1 mg/dL (ref 8.7–10.2)
Chloride: 101 mmol/L (ref 96–106)
Creatinine, Ser: 0.97 mg/dL (ref 0.76–1.27)
Globulin, Total: 3 g/dL (ref 1.5–4.5)
Glucose: 110 mg/dL — ABNORMAL HIGH (ref 70–99)
Potassium: 4.3 mmol/L (ref 3.5–5.2)
Sodium: 141 mmol/L (ref 134–144)
Total Protein: 7.4 g/dL (ref 6.0–8.5)
eGFR: 102 mL/min/1.73

## 2024-01-16 LAB — HEMOGLOBIN A1C
Est. average glucose Bld gHb Est-mCnc: 143 mg/dL
Hgb A1c MFr Bld: 6.6 % — ABNORMAL HIGH (ref 4.8–5.6)

## 2024-01-16 LAB — TSH: TSH: 1.44 u[IU]/mL (ref 0.450–4.500)

## 2024-01-16 LAB — LIPID PANEL
Chol/HDL Ratio: 3.9 ratio (ref 0.0–5.0)
Cholesterol, Total: 212 mg/dL — ABNORMAL HIGH (ref 100–199)
HDL: 54 mg/dL
LDL Chol Calc (NIH): 147 mg/dL — ABNORMAL HIGH (ref 0–99)
Triglycerides: 60 mg/dL (ref 0–149)
VLDL Cholesterol Cal: 11 mg/dL (ref 5–40)

## 2024-01-20 ENCOUNTER — Other Ambulatory Visit: Payer: Self-pay | Admitting: Family Medicine

## 2024-01-20 MED ORDER — METFORMIN HCL ER 500 MG PO TB24: 500.0000 mg | ORAL_TABLET | Freq: Every day | ORAL | 1 refills | Status: AC

## 2024-01-20 NOTE — Telephone Encounter (Signed)
 Pt is agreeable with taking the metformin  and he would like it sent to [facility].  Pt did ask about side effects but I advised him that all medications have side effects but he was able to research the drug- gave the spelling and let provider know if he had questions.

## 2024-01-21 ENCOUNTER — Telehealth: Payer: Self-pay | Admitting: *Deleted

## 2024-01-21 MED ORDER — TIRZEPATIDE 2.5 MG/0.5ML ~~LOC~~ SOAJ: 2.5000 mg | SUBCUTANEOUS | 0 refills | Status: AC

## 2024-01-21 NOTE — Telephone Encounter (Signed)
 Pt calling to see if he could take mounjaro  instead of metformin  for his diabetes

## 2024-01-21 NOTE — Addendum Note (Signed)
 Addended by: Adom Schoeneck, DAN K on: 01/21/2024 12:16 PM   Modules accepted: Orders

## 2024-01-21 NOTE — Telephone Encounter (Signed)
 LVM that mounjaro  was sent to pharmacy and if a PA was required pharmacy will let us  know.

## 2024-01-21 NOTE — Telephone Encounter (Signed)
 I'll send it in but his insurance might not cover it if he has not failed the metformin  first.

## 2024-01-27 ENCOUNTER — Other Ambulatory Visit (HOSPITAL_COMMUNITY): Payer: Self-pay

## 2024-01-27 ENCOUNTER — Telehealth: Payer: Self-pay | Admitting: Pharmacy Technician

## 2024-01-27 NOTE — Telephone Encounter (Signed)
 Pharmacy Patient Advocate Encounter   Received notification from [facility] that prior authorization for Mounjaro  2.5mg /0.69ml auto-injectors is required/requested.   Insurance verification completed.   The patient is insured through [employer].   Per test claim: PA required; PA submitted to above mentioned insurance via Phone Key/confirmation #/[id number] Status is pending

## 2024-01-28 ENCOUNTER — Other Ambulatory Visit (HOSPITAL_COMMUNITY): Payer: Self-pay

## 2024-01-28 NOTE — Telephone Encounter (Signed)
 Additional information form, chart notes, and labs have been faxed back to [facility] today at 10:00am.  Phone number [phone] Fax number [phone]

## 2024-02-02 ENCOUNTER — Other Ambulatory Visit (HOSPITAL_COMMUNITY): Payer: Self-pay

## 2024-02-02 NOTE — Telephone Encounter (Signed)
 Pharmacy Patient Advocate Encounter  Received notification from [facility] that Prior Authorization for Mounjaro  2.5mg /0.80ml auto-injectors has been DENIED.  Full denial letter will be uploaded to the media tab. See denial reason below.   PA #/Case ID/Reference #: [id number]

## 2024-04-16 ENCOUNTER — Ambulatory Visit: Admitting: Family Medicine

## 2024-07-14 ENCOUNTER — Ambulatory Visit: Admitting: Family Medicine
